# Patient Record
Sex: Male | Born: 1965
Health system: Southern US, Community
[De-identification: ages and names within clinical notes are randomized; demographics above are authoritative.]

## PROBLEM LIST (undated history)

## (undated) ENCOUNTER — Emergency Department (HOSPITAL_BASED_OUTPATIENT_CLINIC_OR_DEPARTMENT_OTHER): Admission: EM | Payer: 59

## (undated) DIAGNOSIS — K219 Gastro-esophageal reflux disease without esophagitis: Secondary | ICD-10-CM

## (undated) DIAGNOSIS — M1990 Unspecified osteoarthritis, unspecified site: Secondary | ICD-10-CM

## (undated) DIAGNOSIS — Z8041 Family history of malignant neoplasm of ovary: Secondary | ICD-10-CM

## (undated) DIAGNOSIS — D860 Sarcoidosis of lung: Secondary | ICD-10-CM

## (undated) DIAGNOSIS — C801 Malignant (primary) neoplasm, unspecified: Secondary | ICD-10-CM

## (undated) DIAGNOSIS — T7840XA Allergy, unspecified, initial encounter: Secondary | ICD-10-CM

## (undated) DIAGNOSIS — Z800 Family history of malignant neoplasm of digestive organs: Secondary | ICD-10-CM

## (undated) DIAGNOSIS — Z803 Family history of malignant neoplasm of breast: Secondary | ICD-10-CM

## (undated) HISTORY — PX: COLON SURGERY: SHX602

## (undated) HISTORY — DX: Gastro-esophageal reflux disease without esophagitis: K21.9

## (undated) HISTORY — DX: Allergy, unspecified, initial encounter: T78.40XA

## (undated) HISTORY — DX: Family history of malignant neoplasm of breast: Z80.3

## (undated) HISTORY — PX: COLONOSCOPY: SHX174

## (undated) HISTORY — PX: POLYPECTOMY: SHX149

## (undated) HISTORY — PX: NECK SURGERY: SHX720

## (undated) HISTORY — DX: Family history of malignant neoplasm of digestive organs: Z80.0

## (undated) HISTORY — DX: Family history of malignant neoplasm of ovary: Z80.41

---

## 1993-02-15 DIAGNOSIS — D869 Sarcoidosis, unspecified: Secondary | ICD-10-CM

## 1993-02-15 HISTORY — DX: Sarcoidosis, unspecified: D86.9

## 1996-02-16 DIAGNOSIS — D860 Sarcoidosis of lung: Secondary | ICD-10-CM

## 1996-02-16 HISTORY — DX: Sarcoidosis of lung: D86.0

## 1998-09-12 ENCOUNTER — Encounter: Payer: Self-pay | Admitting: Critical Care Medicine

## 1998-09-12 ENCOUNTER — Ambulatory Visit: Admission: RE | Admit: 1998-09-12 | Discharge: 1998-09-12 | Payer: Self-pay | Admitting: Critical Care Medicine

## 1998-09-12 ENCOUNTER — Encounter (INDEPENDENT_AMBULATORY_CARE_PROVIDER_SITE_OTHER): Payer: Self-pay | Admitting: Specialist

## 1998-09-15 ENCOUNTER — Ambulatory Visit: Admission: RE | Admit: 1998-09-15 | Discharge: 1998-09-15 | Payer: Self-pay | Admitting: Critical Care Medicine

## 2000-04-28 ENCOUNTER — Encounter: Payer: Self-pay | Admitting: Specialist

## 2000-04-28 ENCOUNTER — Ambulatory Visit (HOSPITAL_COMMUNITY): Admission: RE | Admit: 2000-04-28 | Discharge: 2000-04-28 | Payer: Self-pay | Admitting: Specialist

## 2002-07-09 ENCOUNTER — Ambulatory Visit (HOSPITAL_COMMUNITY): Admission: RE | Admit: 2002-07-09 | Discharge: 2002-07-09 | Payer: Self-pay | Admitting: Internal Medicine

## 2002-07-09 ENCOUNTER — Encounter: Payer: Self-pay | Admitting: Internal Medicine

## 2002-10-08 ENCOUNTER — Encounter: Admission: RE | Admit: 2002-10-08 | Discharge: 2002-10-08 | Payer: Self-pay | Admitting: Otolaryngology

## 2002-10-08 ENCOUNTER — Encounter: Payer: Self-pay | Admitting: Otolaryngology

## 2003-02-21 ENCOUNTER — Ambulatory Visit (HOSPITAL_COMMUNITY): Admission: RE | Admit: 2003-02-21 | Discharge: 2003-02-21 | Payer: Self-pay | Admitting: Endocrinology

## 2008-02-16 HISTORY — PX: KNEE ARTHROSCOPY: SUR90

## 2008-10-06 ENCOUNTER — Ambulatory Visit: Payer: Self-pay | Admitting: Interventional Radiology

## 2008-10-06 ENCOUNTER — Emergency Department (HOSPITAL_BASED_OUTPATIENT_CLINIC_OR_DEPARTMENT_OTHER): Admission: EM | Admit: 2008-10-06 | Discharge: 2008-10-06 | Payer: Self-pay | Admitting: Emergency Medicine

## 2008-11-05 ENCOUNTER — Ambulatory Visit (HOSPITAL_COMMUNITY): Admission: RE | Admit: 2008-11-05 | Discharge: 2008-11-05 | Payer: Self-pay | Admitting: Specialist

## 2009-01-17 ENCOUNTER — Ambulatory Visit (HOSPITAL_BASED_OUTPATIENT_CLINIC_OR_DEPARTMENT_OTHER): Admission: RE | Admit: 2009-01-17 | Discharge: 2009-01-17 | Payer: Self-pay | Admitting: Specialist

## 2010-05-19 LAB — POCT HEMOGLOBIN-HEMACUE: Hemoglobin: 16.2 g/dL (ref 13.0–17.0)

## 2011-08-27 ENCOUNTER — Emergency Department
Admission: EM | Admit: 2011-08-27 | Discharge: 2011-08-27 | Disposition: A | Discharge status: Home / Self Care | Payer: Self-pay | Source: Home / Self Care | Attending: Emergency Medicine | Admitting: Emergency Medicine

## 2011-08-27 DIAGNOSIS — L03115 Cellulitis of right lower limb: Secondary | ICD-10-CM

## 2011-08-27 DIAGNOSIS — L03119 Cellulitis of unspecified part of limb: Secondary | ICD-10-CM

## 2011-08-27 DIAGNOSIS — L02419 Cutaneous abscess of limb, unspecified: Secondary | ICD-10-CM

## 2011-08-27 HISTORY — DX: Sarcoidosis of lung: D86.0

## 2011-08-27 MED ORDER — DOXYCYCLINE HYCLATE 100 MG PO CAPS: 100.0000 mg | ORAL_CAPSULE | Freq: Two times a day (BID) | ORAL | Status: DC

## 2011-08-27 MED ORDER — DOXYCYCLINE HYCLATE 100 MG PO CAPS: 100.0000 mg | ORAL_CAPSULE | Freq: Two times a day (BID) | ORAL | Status: AC

## 2011-08-27 NOTE — ED Provider Notes (Signed)
History     CSN: [id number]  Arrival date & time 08/27/11  8413   First MD Initiated Contact with Patient 08/27/11 0830      Chief Complaint  Patient presents with  . Wound Infection    x 2 days     Patient is a 46 y.o. male presenting with rash. The history is provided by the patient.  Rash  The current episode started 2 days ago (Started as a pimple right anterior knee, he squeezed it and got slight pus out.). The problem has been rapidly worsening. Associated with: Pimple.--- No known exposure to contact dermatitis cause. There has been no fever. Affected Location: Right anterior knee. The pain is mild. The pain has been intermittent since onset. Associated symptoms include pain (And swelling of soft tissues over anterior knee. He denies pain within the knee joint itself.). Pertinent negatives include no blisters, no itching and no weeping. He has tried OTC analgesics (Ibuprofen) for the symptoms. The treatment provided no relief.    Past Medical History  Diagnosis Date  . Sarcoidosis of lung 1998    Past Surgical History  Procedure Date  . Knee arthroscopy 2010    right    Family History  Problem Relation Age of Onset  . Hypertension Father   . Cancer Other     Lung  . Cancer Other     Liver  . Cancer Other     pancreatic     History  Substance Use Topics  . Smoking status: Never Smoker   . Smokeless tobacco: Former Neurosurgeon  . Alcohol Use: 2.4 oz/week    4 Glasses of wine per week      Review of Systems  HENT: Negative.   Eyes: Negative.   Respiratory: Negative.   Cardiovascular: Negative.   Gastrointestinal: Negative.   Genitourinary: Negative.   Musculoskeletal: Negative for joint swelling.  Skin: Positive for rash. Negative for itching.  Neurological: Negative.   Hematological: Negative.   All other systems reviewed and are negative.    Allergies  Review of patient's allergies indicates no known allergies.  Home Medications   Current  Outpatient Rx  Name Route Sig Dispense Refill  . IBUPROFEN 200 MG PO TABS Oral Take 200 mg by mouth every 6 (six) hours as needed.    Marland Kitchen DOXYCYCLINE HYCLATE 100 MG PO CAPS Oral Take 1 capsule (100 mg total) by mouth 2 (two) times daily. 20 capsule 0    BP 134/79  Pulse 62  Temp 98.1 F (36.7 C) (Oral)  Resp 17  Ht 5' 11.5" (1.816 m)  Wt 194 lb (87.998 kg)  BMI 26.68 kg/m2  SpO2 100%  Physical Exam  Nursing note and vitals reviewed. Constitutional: He is oriented to person, place, and time. He appears well-developed and well-nourished. No distress.       Uncomfortable from right knee pain. He is able to walk and weight-bear OK.  HENT:  Head: Normocephalic and atraumatic.  Eyes: Conjunctivae and EOM are normal. Pupils are equal, round, and reactive to light. No scleral icterus.  Neck: Normal range of motion.  Cardiovascular: Normal rate.   Pulmonary/Chest: Effort normal.  Abdominal: He exhibits no distension.  Musculoskeletal: Normal range of motion.       Right knee: He exhibits normal range of motion, no effusion and no bony tenderness. No medial joint line, no lateral joint line and no patellar tendon tenderness noted.  Neurological: He is alert and oriented to person, place, and time.  Skin: Rash noted.  Psychiatric: He has a normal mood and affect.    skin: 3 x 3 cm area of erythema, induration, tenderness over right anterior knee soft tissues. In the center of this is a 2 x 2 millimeter area of raised fluctuance. No red streaks.  Legs: No cords or heat or tenderness to suggest DVT.  ED Course  Procedures (including critical care time)   Labs Reviewed  WOUND CULTURE   No results found.   1. Cellulitis of knee, right       MDM  Likely has cellulitis of soft tissue right anterior knee, with tiny abscess/fluctuance, 3 x 3 millimeters in the center of this. No evidence of its affecting the knee joint itself. No evidence of knee effusion.  After risks, benefits,  alternatives discussed, patient agrees with the following: Alcohol prep right anterior knee, and I performed an incision and drainage with a tiny puncture  with a #11 blade. A small amount of pus and blood exuded and this was swabbed and sent for culture. He tolerated the procedure well and the area again cleaned and Band-Aid applied. Good hemostasis. Doxycycline prescribed, will await wound culture. Questions invited and answered.        Lajean Manes, MD 08/27/11 1254

## 2011-08-27 NOTE — ED Notes (Signed)
Eon complains of redness, tenderness and swelling on his right knee. There is a lesion on his right knee that he believes to be infected. He denies fever, chills, or sweats.

## 2011-08-30 ENCOUNTER — Telehealth: Payer: Self-pay | Admitting: Emergency Medicine

## 2011-08-30 ENCOUNTER — Telehealth: Payer: Self-pay | Admitting: Family Medicine

## 2011-08-30 LAB — WOUND CULTURE
Gram Stain: NONE SEEN
Gram Stain: NONE SEEN

## 2013-03-12 ENCOUNTER — Emergency Department
Admission: EM | Admit: 2013-03-12 | Discharge: 2013-03-12 | Disposition: A | Discharge status: Home / Self Care | Payer: 59 | Source: Home / Self Care | Attending: Family Medicine | Admitting: Family Medicine

## 2013-03-12 ENCOUNTER — Encounter: Payer: Self-pay | Admitting: Emergency Medicine

## 2013-03-12 ENCOUNTER — Emergency Department (INDEPENDENT_AMBULATORY_CARE_PROVIDER_SITE_OTHER): Payer: 59

## 2013-03-12 DIAGNOSIS — W19XXXA Unspecified fall, initial encounter: Secondary | ICD-10-CM

## 2013-03-12 DIAGNOSIS — S2239XA Fracture of one rib, unspecified side, initial encounter for closed fracture: Secondary | ICD-10-CM

## 2013-03-12 DIAGNOSIS — S2232XA Fracture of one rib, left side, initial encounter for closed fracture: Secondary | ICD-10-CM

## 2013-03-12 MED ORDER — HYDROCODONE-ACETAMINOPHEN 5-325 MG PO TABS: ORAL_TABLET | ORAL | Status: DC

## 2013-03-12 MED ORDER — MELOXICAM 15 MG PO TABS: 15.0000 mg | ORAL_TABLET | Freq: Every day | ORAL | Status: DC

## 2013-03-12 NOTE — ED Notes (Signed)
Applied Rib belt per Dr. Assunta Found

## 2013-03-12 NOTE — ED Provider Notes (Signed)
CSN: [id number]     Arrival date & time 03/12/13  1600 History   First MD Initiated Contact with Patient 03/12/13 1646     Chief Complaint  Patient presents with  . Chest Pain    left ribs     HPI Comments: While snow skiing 48 hours ago, patient fell on his left side injuring his left chest.  He complains of persistent pain in his left chest, worse with movement and coughing.  Patient is a 48 y.o. male presenting with chest pain. The history is provided by the patient.  Chest Pain Pain location:  L lateral chest Pain quality: sharp   Pain radiates to:  Does not radiate Pain severity:  Mild Onset quality:  Sudden Duration:  2 days Timing:  Constant Progression:  Unchanged Chronicity:  New Context: trauma   Relieved by:  Nothing Worsened by:  Coughing and movement Ineffective treatments:  None tried Associated symptoms: no abdominal pain, no back pain, no cough, no diaphoresis, no fatigue, no fever, no nausea, no palpitations and no shortness of breath     Past Medical History  Diagnosis Date  . Sarcoidosis of lung 1998   Past Surgical History  Procedure Laterality Date  . Knee arthroscopy  2010    right   Family History  Problem Relation Age of Onset  . Hypertension Father   . Cancer Other     Lung  . Cancer Other     Liver  . Cancer Other     pancreatic    History  Substance Use Topics  . Smoking status: Never Smoker   . Smokeless tobacco: Former Systems developer  . Alcohol Use: 2.4 oz/week    4 Glasses of wine per week    Review of Systems  Constitutional: Negative for fever, diaphoresis and fatigue.  Respiratory: Negative for cough and shortness of breath.   Cardiovascular: Positive for chest pain. Negative for palpitations.  Gastrointestinal: Negative for nausea and abdominal pain.  Musculoskeletal: Negative for back pain.  All other systems reviewed and are negative.    Allergies  Review of patient's allergies indicates no known allergies.  Home Medications    Current Outpatient Rx  Name  Route  Sig  Dispense  Refill  . HYDROcodone-acetaminophen (NORCO/VICODIN) 5-325 MG per tablet      Take one by mouth at bedtime as needed for pain   10 tablet   0   . meloxicam (MOBIC) 15 MG tablet   Oral   Take 1 tablet (15 mg total) by mouth daily. Take with food each morning   15 tablet   0    BP 141/95  Pulse 68  Temp(Src) 98 F (36.7 C) (Oral)  Resp 16  Wt 215 lb (97.523 kg)  SpO2 99% Physical Exam  Nursing note and vitals reviewed. Constitutional: He is oriented to person, place, and time. He appears well-developed and well-nourished. No distress.  HENT:  Head: Atraumatic.  Eyes: Conjunctivae are normal. Pupils are equal, round, and reactive to light.  Neck: Normal range of motion.  Cardiovascular: Normal heart sounds.   Pulmonary/Chest: Breath sounds normal. No respiratory distress. He has no wheezes. He has no rales.   He exhibits tenderness.    There is tenderness to palpation left lateral chest as noted.  No ecchymosis, swelling, or crepitance.  Abdominal: There is no tenderness.  Neurological: He is alert and oriented to person, place, and time.  Skin: Skin is warm and dry.    ED Course  Procedures  none    Imaging Review Dg Ribs Unilateral W/chest Left  03/12/2013   CLINICAL DATA:  Fall on left lateral ribs with pain.  EXAM: LEFT RIBS AND CHEST - 3+ VIEW  COMPARISON:  None.  FINDINGS: Frontal view of the chest shows midline trachea and normal heart size. Lungs are clear. No pleural fluid. No pneumothorax.  There is a minimally displaced fracture of the left fifth posterior rib. There may be a nondisplaced fracture of the left fourth rib as well, laterally. Scapula appears grossly intact.  IMPRESSION: Acute left fifth posterior rib fracture. Suspect a nondisplaced fracture of the adjacent left fourth rib as well.   Electronically Signed   By: Lorin Picket M.D.   On: 03/12/2013 16:40      MDM   1. Left rib fracture      Rib belt applied.  Mobic 15mg  daily.  Lortab for pain at night. Apply ice pack 3 to 4 times daily for 2 to 3 days.  Wear rib belt daytime (discontinue if cough or shortness of breath develops).  Recommend Vitamin D and calcium supplement. If symptoms become significantly worse during the night or over the weekend, proceed to the local emergency room. Followup with Family Doctor if not improved in 2 to 3 weeks.    Kandra Nicolas, MD 03/14/13 [phone]

## 2013-03-12 NOTE — Discharge Instructions (Signed)
Apply ice pack 3 to 4 times daily for 2 to 3 days.  Wear rib belt daytime.  Recommend Vitamin D and calcium supplement. If symptoms become significantly worse during the night or over the weekend, proceed to the local emergency room.    Rib Fracture A rib fracture is a break or crack in one of the bones of the ribs. The ribs are a group of long, curved bones that wrap around your chest and attach to your spine. They protect your lungs and other organs in the chest cavity. A broken or cracked rib is often painful, but most do not cause other problems. Most rib fractures heal on their own over time. However, rib fractures can be more serious if multiple ribs are broken or if broken ribs move out of place and push against other structures. CAUSES   A direct blow to the chest. For example, this could happen during contact sports, a car accident, or a fall against a hard object.  Repetitive movements with high force, such as pitching a baseball or having severe coughing spells. SYMPTOMS   Pain when you breathe in or cough.  Pain when someone presses on the injured area. DIAGNOSIS  Your caregiver will perform a physical exam. Various imaging tests may be ordered to confirm the diagnosis and to look for related injuries. These tests may include a chest X-ray, computed tomography (CT), magnetic resonance imaging (MRI), or a bone scan. TREATMENT  Rib fractures usually heal on their own in 1 3 months. The longer healing period is often associated with a continued cough or other aggravating activities. During the healing period, pain control is very important. Medication is usually given to control pain. Hospitalization or surgery may be needed for more severe injuries, such as those in which multiple ribs are broken or the ribs have moved out of place.  HOME CARE INSTRUCTIONS   Avoid strenuous activity and any activities or movements that cause pain. Be careful during activities and avoid bumping the  injured rib.  Gradually increase activity as directed by your caregiver.  Only take over-the-counter or prescription medications as directed by your caregiver. Do not take other medications without asking your caregiver first.  Apply ice to the injured area for the first 1 2 days after you have been treated or as directed by your caregiver. Applying ice helps to reduce inflammation and pain.  Put ice in a plastic bag.  Place a towel between your skin and the bag.   Leave the ice on for 15 20 minutes at a time, every 2 hours while you are awake.  Perform deep breathing as directed by your caregiver. This will help prevent pneumonia, which is a common complication of a broken rib. Your caregiver may instruct you to:  Take deep breaths several times a day.  Try to cough several times a day, holding a pillow against the injured area.  Use a device called an incentive spirometer to practice deep breathing several times a day.  Drink enough fluids to keep your urine clear or pale yellow. This will help you avoid constipation.     SEEK IMMEDIATE MEDICAL CARE IF:   You have a fever.   You have difficulty breathing or shortness of breath.   You develop a continual cough, or you cough up thick or bloody sputum.  You feel sick to your stomach (nausea), throw up (vomit), or have abdominal pain.   You have worsening pain not controlled with medications.  MAKE  SURE YOU:  Understand these instructions.  Will watch your condition.  Will get help right away if you are not doing well or get worse. Document Released: 02/01/2005 Document Revised: 10/04/2012 Document Reviewed: 04/05/2012 [facility] Patient Information 2014 [place], [place].

## 2013-03-12 NOTE — ED Notes (Signed)
Brian Avery fell while skiing and landed on left side with arm and ski pole on left chest. C/o left rib pain, anterior and posterior. Pain is worse with movemtn and coughing.

## 2013-04-04 ENCOUNTER — Other Ambulatory Visit: Payer: Self-pay | Admitting: Podiatry

## 2013-04-04 NOTE — Telephone Encounter (Signed)
Patient has completed 90 days of Lamisil. No refilled needed.

## 2013-04-25 ENCOUNTER — Telehealth: Payer: Self-pay | Admitting: *Deleted

## 2013-04-25 NOTE — Telephone Encounter (Signed)
Pt states he left his antifungal medication in a hotel, would like a refill.

## 2013-04-26 MED ORDER — EFINACONAZOLE 10 % EX SOLN: 1.0000 [drp] | Freq: Every day | CUTANEOUS | Status: DC

## 2013-04-26 MED ORDER — TERBINAFINE HCL 250 MG PO TABS: 250.0000 mg | ORAL_TABLET | Freq: Every day | ORAL | Status: DC

## 2013-04-26 NOTE — Telephone Encounter (Signed)
Its back here on my desk at the base of the scanner.

## 2013-04-26 NOTE — Telephone Encounter (Signed)
Pt states left his rx for Lamisil in a hotel room and would like a refill of the Jublia also.  Dr Paulla Dolly states refill Lamisil 250mg  #30 once only, and the Jublia prn 1 year.  I told pt I would order the Jublia from [facility], for better insurance coverage.

## 2015-06-26 DIAGNOSIS — M25561 Pain in right knee: Secondary | ICD-10-CM | POA: Diagnosis not present

## 2015-07-01 DIAGNOSIS — M25561 Pain in right knee: Secondary | ICD-10-CM | POA: Diagnosis not present

## 2015-09-02 DIAGNOSIS — Z0000 Encounter for general adult medical examination without abnormal findings: Secondary | ICD-10-CM | POA: Diagnosis not present

## 2015-09-08 DIAGNOSIS — Z0000 Encounter for general adult medical examination without abnormal findings: Secondary | ICD-10-CM | POA: Diagnosis not present

## 2015-10-21 DIAGNOSIS — E789 Disorder of lipoprotein metabolism, unspecified: Secondary | ICD-10-CM | POA: Diagnosis not present

## 2015-11-11 DIAGNOSIS — Z0100 Encounter for examination of eyes and vision without abnormal findings: Secondary | ICD-10-CM | POA: Diagnosis not present

## 2016-01-13 NOTE — H&P (Signed)
  Brian Avery is an 50 y.o. male.    Chief Complaint: right knee pain  HPI: Pt is a 50 y.o. male complaining of right knee pain for multiple years. Pain had continually increased since the beginning. X-rays in the clinic show meniscal tear right knee. Pt has tried various conservative treatments which have failed to alleviate their symptoms, including injections and therapy. Various options are discussed with the patient. Risks, benefits and expectations were discussed with the patient. Patient understand the risks, benefits and expectations and wishes to proceed with surgery.   PCP:  No primary care provider on file.  D/C Plans: Home  PMH: Past Medical History:  Diagnosis Date  . Sarcoidosis of lung 1998    PSH: Past Surgical History:  Procedure Laterality Date  . KNEE ARTHROSCOPY  2010   right    Social History:  reports that he has never smoked. He has quit using smokeless tobacco. He reports that he drinks about 2.4 oz of alcohol per week . He reports that he does not use drugs.  Allergies:  No Known Allergies  Medications: No current facility-administered medications for this encounter.    Current Outpatient Prescriptions  Medication Sig Dispense Refill  . Efinaconazole (JUBLIA) 10 % SOLN Apply 1 drop topically daily. 1 Bottle prn  . HYDROcodone-acetaminophen (NORCO/VICODIN) 5-325 MG per tablet Take one by mouth at bedtime as needed for pain 10 tablet 0  . meloxicam (MOBIC) 15 MG tablet Take 1 tablet (15 mg total) by mouth daily. Take with food each morning 15 tablet 0  . terbinafine (LAMISIL) 250 MG tablet Take 1 tablet (250 mg total) by mouth daily. 30 tablet 0    No results found for this or any previous visit (from the past 48 hour(s)). No results found.  ROS: Pain with rom of the right lower extremity  Physical Exam:  Alert and oriented 50 y.o. male in no acute distress Cranial nerves 2-12 intact Cervical spine: full rom with no tenderness, nv intact  distally Chest: active breath sounds bilaterally, no wheeze rhonchi or rales Heart: regular rate and rhythm, no murmur Abd: non tender non distended with active bowel sounds Hip is stable with rom  Right knee with medial joint line tenderness nv intact distally No rashes or edema  Assessment/Plan Assessment: right knee meniscal tear  Plan: Patient will undergo a right knee arthroscopy by Dr. Veverly Fells at [facility]. Risks benefits and expectations were discussed with the patient. Patient understand risks, benefits and expectations and wishes to proceed.

## 2016-01-21 ENCOUNTER — Encounter (HOSPITAL_COMMUNITY): Payer: Self-pay

## 2016-01-21 NOTE — Pre-Procedure Instructions (Signed)
Brian Avery  01/21/2016      [id number] - [place], [place] - [address] [place] [address] Phone: [phone] Fax: [place], [place] [address], [address] [place] [address] Phone: [phone] Fax: [phone]    Your procedure is scheduled on December 15  Report to [facility] at 1100 A.M.  Call this number if you have problems the morning of surgery:  [phone]   Remember:  Do not eat food or drink liquids after midnight.   Take these medicines the morning of surgery with A SIP OF WATER NONE  7 days prior to surgery STOP taking any MOBIC Aspirin, Aleve, Naproxen, Ibuprofen, Motrin, Advil, Goody's, BC's, all herbal medications, fish oil, and all vitamins    Do not wear jewelry.  Do not wear lotions, powders, or cologne, or deoderant.  Men may shave face and neck.  Do not bring valuables to the hospital.  [facility] is not responsible for any belongings or valuables.  Contacts, dentures or bridgework may not be worn into surgery.  Leave your suitcase in the car.  After surgery it may be brought to your room.  For patients admitted to the hospital, discharge time will be determined by your treatment team.  Patients discharged the day of surgery will not be allowed to drive home.    Special instructions:   [facility]- Preparing For Surgery  Before surgery, you can play an important role. Because skin is not sterile, your skin needs to be as free of germs as possible. You can reduce the number of germs on your skin by washing with CHG (chlorahexidine gluconate) Soap before surgery.  CHG is an antiseptic cleaner which kills germs and bonds with the skin to continue killing germs even after washing.  Please do not use if you have an allergy to CHG or antibacterial soaps. If your skin  becomes reddened/irritated stop using the CHG.  Do not shave (including legs and underarms) for at least 48 hours prior to first CHG shower. It is OK to shave your face.  Please follow these instructions carefully.   1. Shower the NIGHT BEFORE SURGERY and the MORNING OF SURGERY with CHG.   2. If you chose to wash your hair, wash your hair first as usual with your normal shampoo.  3. After you shampoo, rinse your hair and body thoroughly to remove the shampoo.  4. Use CHG as you would any other liquid soap. You can apply CHG directly to the skin and wash gently with a scrungie or a clean washcloth.   5. Apply the CHG Soap to your body ONLY FROM THE NECK DOWN.  Do not use on open wounds or open sores. Avoid contact with your eyes, ears, mouth and genitals (private parts). Wash genitals (private parts) with your normal soap.  6. Wash thoroughly, paying special attention to the area where your surgery will be performed.  7. Thoroughly rinse your body with warm water from the neck down.  8. DO NOT shower/wash with your normal soap after using and rinsing off the CHG Soap.  9. Pat yourself dry with a CLEAN TOWEL.   10. Wear CLEAN PAJAMAS   11. Place CLEAN SHEETS on your bed the night of your first shower and DO NOT SLEEP WITH PETS.    Day of Surgery:  Do not apply any deodorants/lotions. Please wear clean clothes to the hospital/surgery center.      Please read over the following fact sheets that you were given.

## 2016-01-22 ENCOUNTER — Encounter (HOSPITAL_COMMUNITY): Payer: Self-pay

## 2016-01-22 ENCOUNTER — Encounter (HOSPITAL_COMMUNITY)
Admission: RE | Admit: 2016-01-22 | Discharge: 2016-01-22 | Disposition: A | Discharge status: Home / Self Care | Payer: 59 | Source: Ambulatory Visit | Attending: Orthopedic Surgery | Admitting: Orthopedic Surgery

## 2016-01-22 DIAGNOSIS — M23203 Derangement of unspecified medial meniscus due to old tear or injury, right knee: Secondary | ICD-10-CM | POA: Insufficient documentation

## 2016-01-22 HISTORY — DX: Unspecified osteoarthritis, unspecified site: M19.90

## 2016-01-22 LAB — CBC
HCT: 45.9 % (ref 39.0–52.0)
HEMOGLOBIN: 15.4 g/dL (ref 13.0–17.0)
MCH: 32 pg (ref 26.0–34.0)
MCHC: 33.6 g/dL (ref 30.0–36.0)
MCV: 95.4 fL (ref 78.0–100.0)
Platelets: 240 10*3/uL (ref 150–400)
RBC: 4.81 MIL/uL (ref 4.22–5.81)
RDW: 13.3 % (ref 11.5–15.5)
WBC: 5.9 10*3/uL (ref 4.0–10.5)

## 2016-01-22 LAB — BASIC METABOLIC PANEL
Anion gap: 9 (ref 5–15)
BUN: 10 mg/dL (ref 6–20)
CO2: 23 mmol/L (ref 22–32)
CREATININE: 1 mg/dL (ref 0.61–1.24)
Calcium: 9 mg/dL (ref 8.9–10.3)
Chloride: 105 mmol/L (ref 101–111)
GFR calc Af Amer: >60 mL/min (ref >60)
GLUCOSE: 96 mg/dL (ref 65–99)
POTASSIUM: 4.5 mmol/L (ref 3.5–5.1)
Sodium: 137 mmol/L (ref 135–145)

## 2016-01-22 NOTE — Progress Notes (Signed)
PCP - Anda Kraft Cardiologist - denies  Chest x-ray - not needed EKG - denies Stress Test -denies  ECHO - denies Cardiac Cath - denies  Patient does not have any cardiac history.    Patient denies shortness of breath, fever, cough and chest pain at PAT appointment

## 2016-01-30 ENCOUNTER — Encounter (HOSPITAL_COMMUNITY)
Admission: RE | Disposition: A | Discharge status: Home / Self Care | Payer: Self-pay | Source: Ambulatory Visit | Attending: Orthopedic Surgery

## 2016-01-30 ENCOUNTER — Ambulatory Visit (HOSPITAL_COMMUNITY): Payer: 59 | Admitting: Anesthesiology

## 2016-01-30 ENCOUNTER — Encounter (HOSPITAL_COMMUNITY): Payer: Self-pay | Admitting: Surgery

## 2016-01-30 ENCOUNTER — Ambulatory Visit (HOSPITAL_COMMUNITY)
Admission: RE | Admit: 2016-01-30 | Discharge: 2016-01-30 | Disposition: A | Discharge status: Home / Self Care | Payer: 59 | Source: Ambulatory Visit | Attending: Orthopedic Surgery | Admitting: Orthopedic Surgery

## 2016-01-30 ENCOUNTER — Telehealth (HOSPITAL_COMMUNITY): Payer: Self-pay | Admitting: *Deleted

## 2016-01-30 DIAGNOSIS — Z8709 Personal history of other diseases of the respiratory system: Secondary | ICD-10-CM | POA: Insufficient documentation

## 2016-01-30 DIAGNOSIS — M2241 Chondromalacia patellae, right knee: Secondary | ICD-10-CM | POA: Diagnosis not present

## 2016-01-30 DIAGNOSIS — S83231A Complex tear of medial meniscus, current injury, right knee, initial encounter: Secondary | ICD-10-CM | POA: Diagnosis not present

## 2016-01-30 DIAGNOSIS — X58XXXA Exposure to other specified factors, initial encounter: Secondary | ICD-10-CM | POA: Diagnosis not present

## 2016-01-30 DIAGNOSIS — Z87891 Personal history of nicotine dependence: Secondary | ICD-10-CM | POA: Diagnosis not present

## 2016-01-30 DIAGNOSIS — Z79899 Other long term (current) drug therapy: Secondary | ICD-10-CM | POA: Insufficient documentation

## 2016-01-30 DIAGNOSIS — M1990 Unspecified osteoarthritis, unspecified site: Secondary | ICD-10-CM | POA: Diagnosis not present

## 2016-01-30 DIAGNOSIS — M94261 Chondromalacia, right knee: Secondary | ICD-10-CM | POA: Diagnosis not present

## 2016-01-30 DIAGNOSIS — S83241A Other tear of medial meniscus, current injury, right knee, initial encounter: Secondary | ICD-10-CM | POA: Diagnosis not present

## 2016-01-30 HISTORY — PX: KNEE ARTHROSCOPY: SHX127

## 2016-01-30 SURGERY — ARTHROSCOPY, KNEE
Anesthesia: General | Laterality: Right

## 2016-01-30 MED ORDER — BUPIVACAINE-EPINEPHRINE (PF) 0.25% -1:200000 IJ SOLN
INTRAMUSCULAR | Status: AC
  Filled 2016-01-30: qty 30

## 2016-01-30 MED ORDER — MIDAZOLAM HCL 2 MG/2ML IJ SOLN
INTRAMUSCULAR | Status: AC
  Filled 2016-01-30: qty 2

## 2016-01-30 MED ORDER — OXYCODONE-ACETAMINOPHEN 5-325 MG PO TABS: 1.0000 | ORAL_TABLET | ORAL | 0 refills | Status: DC | PRN

## 2016-01-30 MED ORDER — OXYCODONE-ACETAMINOPHEN 5-325 MG PO TABS
ORAL_TABLET | ORAL | Status: AC
  Filled 2016-01-30: qty 1

## 2016-01-30 MED ORDER — MIDAZOLAM HCL 5 MG/5ML IJ SOLN
INTRAMUSCULAR | Status: DC | PRN
  Administered 2016-01-30: 2 mg via INTRAVENOUS

## 2016-01-30 MED ORDER — METHOCARBAMOL 500 MG PO TABS: 500.0000 mg | ORAL_TABLET | Freq: Three times a day (TID) | ORAL | 1 refills | Status: DC | PRN

## 2016-01-30 MED ORDER — ONDANSETRON HCL 4 MG/2ML IJ SOLN
INTRAMUSCULAR | Status: DC | PRN
  Administered 2016-01-30: 4 mg via INTRAVENOUS

## 2016-01-30 MED ORDER — BUPIVACAINE-EPINEPHRINE (PF) 0.25% -1:200000 IJ SOLN
INTRAMUSCULAR | Status: DC | PRN
  Administered 2016-01-30: 10 mL via PERINEURAL

## 2016-01-30 MED ORDER — HYDROMORPHONE HCL 1 MG/ML IJ SOLN
0.2500 mg | INTRAMUSCULAR | Status: DC | PRN
  Administered 2016-01-30 (×2): 0.5 mg via INTRAVENOUS

## 2016-01-30 MED ORDER — PROPOFOL 10 MG/ML IV BOLUS
INTRAVENOUS | Status: AC
  Filled 2016-01-30: qty 20

## 2016-01-30 MED ORDER — LACTATED RINGERS IV SOLN: INTRAVENOUS | Status: DC

## 2016-01-30 MED ORDER — CHLORHEXIDINE GLUCONATE 4 % EX LIQD: 60.0000 mL | Freq: Once | CUTANEOUS | Status: DC

## 2016-01-30 MED ORDER — MEPERIDINE HCL 25 MG/ML IJ SOLN: 6.2500 mg | INTRAMUSCULAR | Status: DC | PRN

## 2016-01-30 MED ORDER — PHENYLEPHRINE HCL 10 MG/ML IJ SOLN
INTRAVENOUS | Status: DC | PRN
  Administered 2016-01-30: 25 ug/min via INTRAVENOUS

## 2016-01-30 MED ORDER — LIDOCAINE 2% (20 MG/ML) 5 ML SYRINGE
INTRAMUSCULAR | Status: DC | PRN
  Administered 2016-01-30: 80 mg via INTRAVENOUS

## 2016-01-30 MED ORDER — KETOROLAC TROMETHAMINE 30 MG/ML IJ SOLN
30.0000 mg | Freq: Once | INTRAMUSCULAR | Status: AC
  Administered 2016-01-30: 30 mg via INTRAVENOUS
  Filled 2016-01-30: qty 1

## 2016-01-30 MED ORDER — LACTATED RINGERS IV SOLN
INTRAVENOUS | Status: DC
  Administered 2016-01-30: 14:00:00 via INTRAVENOUS

## 2016-01-30 MED ORDER — HYDROMORPHONE HCL 1 MG/ML IJ SOLN
INTRAMUSCULAR | Status: AC
  Filled 2016-01-30: qty 0.5

## 2016-01-30 MED ORDER — CEFAZOLIN SODIUM-DEXTROSE 2-4 GM/100ML-% IV SOLN
2.0000 g | INTRAVENOUS | Status: AC
  Administered 2016-01-30: 2 g via INTRAVENOUS
  Filled 2016-01-30: qty 100

## 2016-01-30 MED ORDER — FENTANYL CITRATE (PF) 100 MCG/2ML IJ SOLN
INTRAMUSCULAR | Status: AC
  Filled 2016-01-30: qty 2

## 2016-01-30 MED ORDER — KETOROLAC TROMETHAMINE 10 MG PO TABS: 10.0000 mg | ORAL_TABLET | Freq: Three times a day (TID) | ORAL | 0 refills | Status: DC

## 2016-01-30 MED ORDER — KETOROLAC TROMETHAMINE 30 MG/ML IJ SOLN
INTRAMUSCULAR | Status: AC
  Filled 2016-01-30: qty 1

## 2016-01-30 MED ORDER — FENTANYL CITRATE (PF) 100 MCG/2ML IJ SOLN
INTRAMUSCULAR | Status: DC | PRN
  Administered 2016-01-30: 25 ug via INTRAVENOUS
  Administered 2016-01-30: 50 ug via INTRAVENOUS

## 2016-01-30 MED ORDER — PROPOFOL 10 MG/ML IV BOLUS
INTRAVENOUS | Status: DC | PRN
  Administered 2016-01-30: 200 mg via INTRAVENOUS

## 2016-01-30 MED ORDER — OXYCODONE-ACETAMINOPHEN 5-325 MG PO TABS
1.0000 | ORAL_TABLET | Freq: Once | ORAL | Status: AC
  Administered 2016-01-30: 1 via ORAL

## 2016-01-30 MED ORDER — ONDANSETRON HCL 4 MG/2ML IJ SOLN
INTRAMUSCULAR | Status: AC
  Filled 2016-01-30: qty 2

## 2016-01-30 MED ORDER — SODIUM CHLORIDE 0.9 % IR SOLN
Status: DC | PRN
  Administered 2016-01-30 (×2): 3000 mL

## 2016-01-30 MED ORDER — PROMETHAZINE HCL 25 MG/ML IJ SOLN: 6.2500 mg | INTRAMUSCULAR | Status: DC | PRN

## 2016-01-30 SURGICAL SUPPLY — 39 items
BLADE CUTTER GATOR 3.5 (BLADE) ×3 IMPLANT
BNDG CMPR MED 10X6 ELC LF (GAUZE/BANDAGES/DRESSINGS) ×1
BNDG COHESIVE 6X5 TAN STRL LF (GAUZE/BANDAGES/DRESSINGS) ×3 IMPLANT
BNDG ELASTIC 6X10 VLCR STRL LF (GAUZE/BANDAGES/DRESSINGS) ×3 IMPLANT
BNDG GAUZE ELAST 4 BULKY (GAUZE/BANDAGES/DRESSINGS) ×4 IMPLANT
CLOSURE STERI-STRIP 1/2X4 (GAUZE/BANDAGES/DRESSINGS) ×1
CLOSURE WOUND 1/2 X4 (GAUZE/BANDAGES/DRESSINGS) ×1
CLSR STERI-STRIP ANTIMIC 1/2X4 (GAUZE/BANDAGES/DRESSINGS) ×1 IMPLANT
DRAPE ARTHROSCOPY W/POUCH 114 (DRAPES) ×3 IMPLANT
DRSG PAD ABDOMINAL 8X10 ST (GAUZE/BANDAGES/DRESSINGS) ×2 IMPLANT
DURAPREP 26ML APPLICATOR (WOUND CARE) ×3 IMPLANT
ELECT MENISCUS 165MM 90D (ELECTRODE) IMPLANT
ELECT REM PT RETURN 9FT ADLT (ELECTROSURGICAL) ×3
ELECTRODE REM PT RTRN 9FT ADLT (ELECTROSURGICAL) ×1 IMPLANT
GAUZE SPONGE 4X4 12PLY STRL (GAUZE/BANDAGES/DRESSINGS) ×2 IMPLANT
GAUZE SPONGE 4X4 16PLY XRAY LF (GAUZE/BANDAGES/DRESSINGS) ×3 IMPLANT
GLOVE BIOGEL PI ORTHO PRO SZ8 (GLOVE) ×2
GLOVE PI ORTHO PRO STRL SZ8 (GLOVE) ×1 IMPLANT
GLOVE SURG ORTHO 8.5 STRL (GLOVE) ×3 IMPLANT
GOWN STRL REUS W/ TWL XL LVL3 (GOWN DISPOSABLE) ×2 IMPLANT
GOWN STRL REUS W/TWL XL LVL3 (GOWN DISPOSABLE) ×6
IV NS IRRIG 3000ML ARTHROMATIC (IV SOLUTION) ×4 IMPLANT
KIT BASIN OR (CUSTOM PROCEDURE TRAY) ×3 IMPLANT
KIT ROOM TURNOVER OR (KITS) ×3 IMPLANT
MANIFOLD NEPTUNE II (INSTRUMENTS) ×3 IMPLANT
PACK ARTHROSCOPY DSU (CUSTOM PROCEDURE TRAY) ×3 IMPLANT
PAD ARMBOARD 7.5X6 YLW CONV (MISCELLANEOUS) ×6 IMPLANT
PENCIL BUTTON HOLSTER BLD 10FT (ELECTRODE) IMPLANT
PROBE BIPOLAR ATHRO 135MM 90D (MISCELLANEOUS) ×2 IMPLANT
SET ARTHROSCOPY TUBING (MISCELLANEOUS) ×3
SET ARTHROSCOPY TUBING LN (MISCELLANEOUS) ×1 IMPLANT
STRIP CLOSURE SKIN 1/2X4 (GAUZE/BANDAGES/DRESSINGS) ×2 IMPLANT
SUT MNCRL AB 4-0 PS2 18 (SUTURE) ×3 IMPLANT
TOWEL OR 17X24 6PK STRL BLUE (TOWEL DISPOSABLE) ×6 IMPLANT
TUBE CONNECTING 12'X1/4 (SUCTIONS) ×1
TUBE CONNECTING 12X1/4 (SUCTIONS) ×2 IMPLANT
WAND HAND CNTRL MULTIVAC 90 (MISCELLANEOUS) IMPLANT
WATER STERILE IRR 1000ML POUR (IV SOLUTION) ×1 IMPLANT
WRAP KNEE MAXI GEL POST OP (GAUZE/BANDAGES/DRESSINGS) ×3 IMPLANT

## 2016-01-30 NOTE — Transfer of Care (Signed)
Immediate Anesthesia Transfer of Care Note  Patient: Brian Avery  Procedure(s) Performed: Procedure(s): ARTHROSCOPY KNEE (Right)  Patient Location: PACU  Anesthesia Type:General  Level of Consciousness: awake, alert  and oriented  Airway & Oxygen Therapy: Patient Spontanous Breathing and Patient connected to nasal cannula oxygen  Post-op Assessment: Report given to RN, Post -op Vital signs reviewed and stable and Patient moving all extremities X 4  Post vital signs: Reviewed and stable  Last Vitals:  Vitals:   01/30/16 1400 01/30/16 1731  BP: (!) 154/91   Pulse: 75   Resp: 20   Temp: 36.9 C 36.3 C    Last Pain:  Vitals:   01/30/16 1731  TempSrc:   PainSc: 0-No pain      Patients Stated Pain Goal: 3 ([id number])  Complications: No apparent anesthesia complications

## 2016-01-30 NOTE — Brief Op Note (Signed)
01/30/2016  5:29 PM  PATIENT:  Brian Avery  50 y.o. male  PRE-OPERATIVE DIAGNOSIS:  RIGHT KNEE MEDIAL MENISCUS TEAR, chondromalacia   POST-OPERATIVE DIAGNOSIS:  RIGHT KNEE MEDIAL MENISCUS TEAR, chondromalacia   PROCEDURE:  Procedure(s): ARTHROSCOPY KNEE (Right) partial medial meniscectomy and chondroplasty med and patellofemoral  SURGEON:  Surgeon(s) and Role:    * Netta Cedars, MD - Primary  PHYSICIAN ASSISTANT:   ASSISTANTS: none   ANESTHESIA:   local and general  EBL:  No intake/output data recorded.  BLOOD ADMINISTERED:none  DRAINS: none   LOCAL MEDICATIONS USED:  MARCAINE     SPECIMEN:  No Specimen  DISPOSITION OF SPECIMEN:  N/A  COUNTS:  YES  TOURNIQUET:  * No tourniquets in log *  DICTATION: .Other Dictation: Dictation Number [id number]  PLAN OF CARE: Discharge to home after PACU  PATIENT DISPOSITION:  PACU - hemodynamically stable.   Delay start of Pharmacological VTE agent (>24hrs) due to surgical blood loss or risk of bleeding: not applicable

## 2016-01-30 NOTE — Discharge Instructions (Signed)
Ice to the knee as much as possible.  Use the crutches to take most of your body weight off the right leg, but make sure to walk correctly, heel to toe. Keep the surgical bandage in place until Sunday then change to Band Aids over the Steri Strips and then pull the other TED hose on.  Keep on both legs until you see Dr Veverly Fells in the office  Elevate the right leg when you can, prop under the ankle NOT the knee. Keep knee out straight when resting it.  Do the following exercises every hour while awake for 5 minutes -   Ankle pumps Heel slides (knee bending) Thigh tightening and straight leg raising,  Take one 325 mg Aspirin per day for 30 days for blood clot prevention  Follow up with Dr Veverly Fells or Leroy Sea in the office in two weeks, call [phone]

## 2016-01-30 NOTE — Interval H&P Note (Signed)
History and Physical Interval Note:  01/30/2016 3:56 PM  Juline Patch  has presented today for surgery, with the diagnosis of RIGHT KNEE MEDIAL MENISCUS TEAR  The various methods of treatment have been discussed with the patient and family. After consideration of risks, benefits and other options for treatment, the patient has consented to  Procedure(s): ARTHROSCOPY KNEE (Right) as a surgical intervention .  The patient's history has been reviewed, patient examined, no change in status, stable for surgery.  I have reviewed the patient's chart and labs.  Questions were answered to the patient's satisfaction.     Nickole Adamek,STEVEN R

## 2016-01-30 NOTE — Anesthesia Preprocedure Evaluation (Addendum)
Anesthesia Evaluation  Patient identified by MRN, date of birth, ID band Patient awake    Reviewed: Allergy & Precautions, NPO status , Patient's Chart, lab work & pertinent test results  Airway Mallampati: I  TM Distance: >3 FB Neck ROM: Full    Dental  (+) Teeth Intact, Dental Advisory Given   Pulmonary neg pulmonary ROS,    breath sounds clear to auscultation       Cardiovascular negative cardio ROS   Rhythm:Regular Rate:Normal     Neuro/Psych negative neurological ROS  negative psych ROS   GI/Hepatic negative GI ROS, Neg liver ROS,   Endo/Other  negative endocrine ROS  Renal/GU negative Renal ROS  negative genitourinary   Musculoskeletal  (+) Arthritis , Osteoarthritis,    Abdominal   Peds negative pediatric ROS (+)  Hematology negative hematology ROS (+)   Anesthesia Other Findings   Reproductive/Obstetrics negative OB ROS                            Anesthesia Physical Anesthesia Plan  ASA: I  Anesthesia Plan: General   Post-op Pain Management:    Induction: Intravenous  Airway Management Planned: LMA  Additional Equipment:   Intra-op Plan:   Post-operative Plan: Extubation in OR  Informed Consent: I have reviewed the patients History and Physical, chart, labs and discussed the procedure including the risks, benefits and alternatives for the proposed anesthesia with the patient or authorized representative who has indicated his/her understanding and acceptance.   Dental advisory given  Plan Discussed with: Anesthesiologist and CRNA  Anesthesia Plan Comments:         Anesthesia Quick Evaluation

## 2016-01-30 NOTE — Anesthesia Procedure Notes (Signed)
Procedure Name: LMA Insertion Date/Time: 01/30/2016 4:07 PM Performed by: Rush Farmer E Pre-anesthesia Checklist: Patient identified, Emergency Drugs available, Suction available and Patient being monitored Patient Re-evaluated:Patient Re-evaluated prior to inductionOxygen Delivery Method: Circle system utilized Preoxygenation: Pre-oxygenation with 100% oxygen Intubation Type: IV induction LMA: LMA inserted LMA Size: 4.0 Number of attempts: 1 Placement Confirmation: positive ETCO2 and breath sounds checked- equal and bilateral Tube secured with: Tape Dental Injury: Teeth and Oropharynx as per pre-operative assessment

## 2016-02-01 NOTE — Anesthesia Postprocedure Evaluation (Signed)
Anesthesia Post Note  Patient: Brian Avery  Procedure(s) Performed: Procedure(s) (LRB): ARTHROSCOPY KNEE (Right)  Patient location during evaluation: PACU Anesthesia Type: General Level of consciousness: awake and alert Pain management: pain level controlled Vital Signs Assessment: post-procedure vital signs reviewed and stable Respiratory status: spontaneous breathing, nonlabored ventilation, respiratory function stable and patient connected to nasal cannula oxygen Cardiovascular status: blood pressure returned to baseline and stable Postop Assessment: no signs of nausea or vomiting Anesthetic complications: no    Last Vitals:  Vitals:   01/30/16 1731 01/30/16 1830  BP: 121/74   Pulse: 73   Resp: 15   Temp: 36.3 C 36.4 C    Last Pain:  Vitals:   01/30/16 1830  TempSrc:   PainSc: [place]

## 2016-02-02 ENCOUNTER — Encounter (HOSPITAL_COMMUNITY): Payer: Self-pay | Admitting: Orthopedic Surgery

## 2016-02-02 NOTE — Op Note (Signed)
NAME:  Brian Avery, Brian Avery                     ACCOUNT NO.:  MEDICAL RECORD NO.:  [id number]  LOCATION:                                 FACILITY:  PHYSICIAN:  Doran Heater. Veverly Fells, M.D.      DATE OF BIRTH:  DATE OF PROCEDURE:  01/30/2016 DATE OF DISCHARGE:                              OPERATIVE REPORT   PREOPERATIVE DIAGNOSES: 1. Right knee medial meniscus tear. 2. Chondromalacia.  POSTOPERATIVE DIAGNOSES: 1. Right knee medial meniscus tear. 2. Chondromalacia.  PROCEDURE PERFORMED:  Right knee arthroscopy, partial meniscectomy, and chondroplasty, not bleeding bone, medial and patellofemoral joint.  ATTENDING SURGEON:  Doran Heater. Veverly Fells, MD.  ASSISTANT:  None.  ANESTHESIA:  LMA general anesthesia was used plus local.  ESTIMATED BLOOD LOSS:  Minimal.  FLUID REPLACEMENT:  1200 mL crystalloid.  INSTRUMENT COUNTS:  Correct.  COMPLICATIONS:  There were no complications.  ANTIBIOTICS:  Perioperative antibiotics were given.  INDICATIONS:  The patient is a 50 year old male with worsening right medial knee pain secondary to a displaced medial meniscus tear.  The patient has had pain now for greater than 6 months.  He has exhausted all measures of conservative management including modification of activity, anti-inflammatory medications, and desires knee arthroscopy to remove the unstable meniscal tissue and restore function and eliminate pain in the knee.  Informed consent obtained.  DESCRIPTION OF PROCEDURE:  After an adequate level of anesthesia was achieved, the patient was positioned in the supine position.  The right leg correctly identified, placed on arthroscopic leg holder.  Left leg placed in a well leg holder and sterilely prepped and drape of the right knee.  We entered the knee using standard arthroscopic portals including superolateral outflow, anterolateral scope, and the anteromedial working portals.  I identified significant tearing of the medial meniscus.   Upon entering the medial compartment, this was a complex tear, not amenable to repair, was a primarily horizontal cleavage tear, but some little bit of radial component to it.  We went ahead and removed all unstable meniscal tissue using basket forceps and motorized shaver.  About 20% to 30% of posterior horn of meniscus had to be removed.  Meniscal root was easily visualized and completely intact and __________ preserved.  The meniscus basically was normal from the midbody forward as probed and visualized from both femoral and tibial surfaces.  The articular cartilage weightbearing surface medial femoral condyle had a fairly large grade 3 chondromalacia area.  This was not very deep, but there was definitely loose flaps and fibrillation.  Tangential chondroplasty technique used to remove unstable articular cartilage and smooth transition between the normal areas in the periphery in the more central area that was worn.  Tibial cartilage were looked normal.  ACL, PCL intact.  Lateral compartment completely pristine.  Meniscus probed and visually normal, probed and felt to be stable.  The articular cartilage as well normal.  Scoped the posterior aspect of the knee and no loose meniscal fragments identified.  The final inspection throughout the entire knee joint, there was grade 3 chondromalacia under the patella. None on the trochlea.  We just did a gentle tangential chondroplasty there as there was  just probably 0.5 cm by 3/4 cm area of erupted cartilage.  There was some loose foot fibrillation and flaps.  Once that was debrided, we did a final lavage of the knee joint suturing the wounds with 4-0 Monocryl followed by Steri-Strips and sterile compressive bandage.  The patient tolerated the surgery well.     Doran Heater. Veverly Fells, M.D.     SRN/MEDQ  D:  01/30/2016  T:  01/31/2016  Job:  [id number]

## 2016-09-20 ENCOUNTER — Ambulatory Visit: Payer: 59 | Admitting: Sports Medicine

## 2016-10-07 ENCOUNTER — Ambulatory Visit (INDEPENDENT_AMBULATORY_CARE_PROVIDER_SITE_OTHER): Payer: 59 | Admitting: Sports Medicine

## 2016-10-07 VITALS — BP 132/84 | Ht 72.0 in | Wt 205.0 lb

## 2016-10-07 DIAGNOSIS — G8929 Other chronic pain: Secondary | ICD-10-CM

## 2016-10-07 DIAGNOSIS — M25522 Pain in left elbow: Secondary | ICD-10-CM

## 2016-10-07 DIAGNOSIS — M25561 Pain in right knee: Secondary | ICD-10-CM | POA: Diagnosis not present

## 2016-10-07 NOTE — Progress Notes (Signed)
Subjective:    Patient ID: Brian Avery, male    DOB: 12-21-1965, 51 y.o.   MRN: [id number]  HPI Brian Avery is a 51 yo very pleasant male presenting with multiple MSK complaints.  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  Left elbow pain - 2/2 fall at work - Occurred 6-7 years ago - Bothered him for a few months after and then got x-rays which showed sheared off bone spur per patient - X-rays from 1-2 years ago brought in today which were taken for a different injury do show fragmented bone spur - Pain worse with activities such as push ups, planks, tricep extensions - Has taken ibuprofen and tried activity modification - Denies swelling, catching, or locking  Right knee pain - 2/2 fall with hyperextension mechanism while doing yardwork at home - Feels like worsening - Constat, low, dull throb - Worse with squatting - Gets stiff after being in same position for long periods of time such as sitting or standing - Reports swelling and sensation of lateral instability - Denies catching, popping - Ice helps a little - Right knee scope with partial meniscectomy and chondroplasty of medial and patellofemoral joint 01/2016 for medial meniscus tear and articular cartilage weightbearing surface medial femoral condyle had a fairly large grade 3 chondromalacia area per report.  Also noted, grade 3 chondromalacia under the patella.  Bilateral heel pain - Right worse than left - A lot of discomfort standing and walking - Can be on feet for 10-12 hours per day with work - Worse in morning - Denies injuries - Has tried OTC Dr. Zoe Lan inserts previously  Right shoulder - MRI in 2010 shoes labral tear, but did not want surgery at the time per patient - [place] 2 weeks ago and has had pain flare up since then  Review of Systems Per HPI    Objective:   Physical Exam    Constitutional: He is oriented to person, place, and time. He appears well-developed and well-nourished.  HENT:  Head: Normocephalic and atraumatic.  Eyes: Conjunctivae are normal.  Pulmonary/Chest: Effort normal.  Abdominal: He exhibits no distension.  Musculoskeletal:       Right shoulder: He exhibits crepitus and pain. He exhibits normal range of motion, no tenderness, no bony tenderness, no swelling and normal strength.       Right knee: He exhibits normal range of motion, no ecchymosis, no deformity and no erythema. Tenderness found. Medial joint line tenderness noted. No lateral joint line tenderness noted.       Left forearm: He exhibits tenderness (Olecranon process) and bony tenderness (Olecranon process). He exhibits no swelling, no edema, no deformity and no laceration.  L elbow: Full ROM with flexion, extension, supination, pronation.  Strength 5/5 with flexion, extension, supination, and pronation, but does have pain with resisted extension.  Neurovascularly intact  R knee: Full ROM with flexion and extension.  Strength 5/5 with flexion and extension.  Thessaly's positive.   Bilateral feet:  TTP at origin of plantar fascia bilaterally.  Heel squeeze negative.  Right shoulder: Good ROM.  Crank test negative.  O'brien's negative.  Neurological: He is alert and oriented to person, place, and time.  Skin: Skin is warm.  Psychiatric: He has a normal mood and affect. His behavior is normal. Judgment and thought content normal.   I personally reviewed and interpreted images  brought in today by patient on discs. L elbow x-rays reveal fragmented bone spur of olecranon process    Assessment & Plan:  1. L Elbow pain  Likely 2/2 olecranon process fragmented bone spur - Will bring back for ultrasound to look for possible tendon injury  2. 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  Patient interested in 6 week trial to see if it helps.  3. Bilateral foot pain Likely plantar fasciitis - Advised to bring in work boots to follow up appointment to fit for insoles  4. Right shoulder pain Hx of bilateral labral tears - Will have fill out release of records paper today to obtain MRI from 2010 per patient report  Follow up in 1 month for ultrasound of left elbow and for orthotics for his work boots.  Addendum: [facility] has purged the MRI from 2010. There is no report available for review.

## 2016-11-04 ENCOUNTER — Ambulatory Visit: Payer: 59 | Admitting: Sports Medicine

## 2016-11-12 DIAGNOSIS — Z0100 Encounter for examination of eyes and vision without abnormal findings: Secondary | ICD-10-CM | POA: Diagnosis not present

## 2017-11-24 DIAGNOSIS — Z0100 Encounter for examination of eyes and vision without abnormal findings: Secondary | ICD-10-CM | POA: Diagnosis not present

## 2017-11-28 DIAGNOSIS — Z1211 Encounter for screening for malignant neoplasm of colon: Secondary | ICD-10-CM | POA: Diagnosis not present

## 2017-11-28 DIAGNOSIS — Z0000 Encounter for general adult medical examination without abnormal findings: Secondary | ICD-10-CM | POA: Diagnosis not present

## 2017-11-28 DIAGNOSIS — M79641 Pain in right hand: Secondary | ICD-10-CM | POA: Diagnosis not present

## 2017-11-28 DIAGNOSIS — Z23 Encounter for immunization: Secondary | ICD-10-CM | POA: Diagnosis not present

## 2017-12-06 DIAGNOSIS — Z0000 Encounter for general adult medical examination without abnormal findings: Secondary | ICD-10-CM | POA: Diagnosis not present

## 2018-10-09 DIAGNOSIS — J301 Allergic rhinitis due to pollen: Secondary | ICD-10-CM | POA: Diagnosis not present

## 2018-10-09 DIAGNOSIS — K219 Gastro-esophageal reflux disease without esophagitis: Secondary | ICD-10-CM | POA: Diagnosis not present

## 2018-10-13 DIAGNOSIS — H5213 Myopia, bilateral: Secondary | ICD-10-CM | POA: Diagnosis not present

## 2019-04-17 ENCOUNTER — Encounter: Payer: Self-pay | Admitting: Gastroenterology

## 2019-05-21 ENCOUNTER — Ambulatory Visit (AMBULATORY_SURGERY_CENTER): Payer: Self-pay | Admitting: *Deleted

## 2019-05-21 ENCOUNTER — Other Ambulatory Visit: Payer: Self-pay

## 2019-05-21 VITALS — Temp 97.1°F | Ht 72.0 in | Wt 206.0 lb

## 2019-05-21 DIAGNOSIS — Z1211 Encounter for screening for malignant neoplasm of colon: Secondary | ICD-10-CM

## 2019-05-21 DIAGNOSIS — Z01818 Encounter for other preprocedural examination: Secondary | ICD-10-CM

## 2019-05-21 MED ORDER — NA SULFATE-K SULFATE-MG SULF 17.5-3.13-1.6 GM/177ML PO SOLN: 1.0000 | Freq: Once | ORAL | 0 refills | Status: AC

## 2019-05-21 NOTE — Progress Notes (Signed)

## 2019-05-30 ENCOUNTER — Other Ambulatory Visit: Payer: Self-pay

## 2019-05-30 ENCOUNTER — Other Ambulatory Visit: Payer: Self-pay | Admitting: Gastroenterology

## 2019-05-30 ENCOUNTER — Ambulatory Visit (INDEPENDENT_AMBULATORY_CARE_PROVIDER_SITE_OTHER): Payer: 59

## 2019-05-30 DIAGNOSIS — Z1159 Encounter for screening for other viral diseases: Secondary | ICD-10-CM | POA: Diagnosis not present

## 2019-05-30 MED FILL — SUPREP BOWEL PREP KIT: 17.5-3.13-1 | 1 days supply | Qty: 354 | Fill #0

## 2019-05-31 LAB — SARS CORONAVIRUS 2 (TAT 6-24 HRS): SARS Coronavirus 2: NEGATIVE

## 2019-06-04 ENCOUNTER — Other Ambulatory Visit (INDEPENDENT_AMBULATORY_CARE_PROVIDER_SITE_OTHER): Payer: 59

## 2019-06-04 ENCOUNTER — Encounter: Payer: Self-pay | Admitting: Gastroenterology

## 2019-06-04 ENCOUNTER — Ambulatory Visit (AMBULATORY_SURGERY_CENTER): Payer: 59 | Admitting: Gastroenterology

## 2019-06-04 ENCOUNTER — Other Ambulatory Visit: Payer: Self-pay

## 2019-06-04 VITALS — BP 123/80 | HR 66 | Temp 97.1°F | Resp 17 | Ht 72.0 in | Wt 212.0 lb

## 2019-06-04 DIAGNOSIS — C187 Malignant neoplasm of sigmoid colon: Secondary | ICD-10-CM

## 2019-06-04 DIAGNOSIS — Z1211 Encounter for screening for malignant neoplasm of colon: Secondary | ICD-10-CM

## 2019-06-04 DIAGNOSIS — D120 Benign neoplasm of cecum: Secondary | ICD-10-CM

## 2019-06-04 LAB — COMPREHENSIVE METABOLIC PANEL
ALT: 39 U/L (ref 0–53)
AST: 37 U/L (ref 0–37)
Albumin: 4.5 g/dL (ref 3.5–5.2)
Alkaline Phosphatase: 42 U/L (ref 39–117)
BUN: 6 mg/dL (ref 6–23)
CO2: 26 mEq/L (ref 19–32)
Calcium: 8.7 mg/dL (ref 8.4–10.5)
Chloride: 102 mEq/L (ref 96–112)
Creatinine, Ser: 0.86 mg/dL (ref 0.40–1.50)
GFR: 93.02 mL/min (ref >60.00)
Glucose, Bld: 88 mg/dL (ref 70–99)
Potassium: 4.2 mEq/L (ref 3.5–5.1)
Sodium: 136 mEq/L (ref 135–145)
Total Bilirubin: 0.7 mg/dL (ref 0.2–1.2)
Total Protein: 6.9 g/dL (ref 6.0–8.3)

## 2019-06-04 LAB — CBC
HCT: 44.2 % (ref 39.0–52.0)
Hemoglobin: 14.7 g/dL (ref 13.0–17.0)
MCHC: 33.2 g/dL (ref 30.0–36.0)
MCV: 99.1 fl (ref 78.0–100.0)
Platelets: 230 10*3/uL (ref 150.0–400.0)
RBC: 4.46 Mil/uL (ref 4.22–5.81)
RDW: 14.3 % (ref 11.5–15.5)
WBC: 6.5 10*3/uL (ref 4.0–10.5)

## 2019-06-04 MED ORDER — SODIUM CHLORIDE 0.9 % IV SOLN: 500.0000 mL | Freq: Once | INTRAVENOUS | Status: DC

## 2019-06-04 NOTE — Patient Instructions (Signed)
YOU HAD AN ENDOSCOPIC PROCEDURE TODAY AT [facility]:   Refer to the procedure report that was given to you for any specific questions about what was found during the examination.  If the procedure report does not answer your questions, please call your gastroenterologist to clarify.  If you requested that your care partner not be given the details of your procedure findings, then the procedure report has been included in a sealed envelope for you to review at your convenience later.  YOU SHOULD EXPECT: Some feelings of bloating in the abdomen. Passage of more gas than usual.  Walking can help get rid of the air that was put into your GI tract during the procedure and reduce the bloating. If you had a lower endoscopy (such as a colonoscopy or flexible sigmoidoscopy) you may notice spotting of blood in your stool or on the toilet paper. If you underwent a bowel prep for your procedure, you may not have a normal bowel movement for a few days.  Please Note:  You might notice some irritation and congestion in your nose or some drainage.  This is from the oxygen used during your procedure.  There is no need for concern and it should clear up in a day or so.  SYMPTOMS TO REPORT IMMEDIATELY:   Following lower endoscopy (colonoscopy or flexible sigmoidoscopy):  Excessive amounts of blood in the stool  Significant tenderness or worsening of abdominal pains  Swelling of the abdomen that is new, acute  Fever of 100F or higher   For urgent or emergent issues, a gastroenterologist can be reached at any hour by calling [phone]. Do not use MyChart messaging for urgent concerns.    DIET:  We do recommend a small meal at first, but then you may proceed to your regular diet.  Drink plenty of fluids but you should avoid alcoholic beverages for 24 hours.  MEDICATIONS: Continue present medications.  Follow Up: Have labs drawn for BUN/Creatinine prior to discharge.  Follow Up: Have CT of  chest, abdomen, and pelvis with contrast. Your recovery nurse has given you 2 bottles of Readi-Cat (oral contrast media) prior to discharge as you will need this for the CT scan. Dr. Woodward Ku office nurse will call you to schedule this appointment and give you instructions for CT scan.  Please see handouts given to you by your recovery nurse.  ACTIVITY:  You should plan to take it easy for the rest of today and you should NOT DRIVE or use heavy machinery until tomorrow (because of the sedation medicines used during the test).    FOLLOW UP: Our staff will call the number listed on your records 48-72 hours following your procedure to check on you and address any questions or concerns that you may have regarding the information given to you following your procedure. If we do not reach you, we will leave a message.  We will attempt to reach you two times.  During this call, we will ask if you have developed any symptoms of COVID 19. If you develop any symptoms (ie: fever, flu-like symptoms, shortness of breath, cough etc.) before then, please call [phone].  If you test positive for Covid 19 in the 2 weeks post procedure, please call and report this information to [place].    If any biopsies were taken you will be contacted by phone or by letter within the next 1-3 weeks.  Please call us at [phone] if you have not heard about the  biopsies in 3 weeks.   Thank you for allowing [place] to provide for your healthcare needs today.   SIGNATURES/CONFIDENTIALITY: You and/or your care partner have signed paperwork which will be entered into your electronic medical record.  These signatures attest to the fact that that the information above on your After Visit Summary has been reviewed and is understood.  Full responsibility of the confidentiality of this discharge information lies with you and/or your care-partner.

## 2019-06-04 NOTE — Progress Notes (Signed)
Called to room to assist during endoscopic procedure.  Patient ID and intended procedure confirmed with present staff. Received instructions for my participation in the procedure from the performing physician.  

## 2019-06-04 NOTE — Progress Notes (Signed)
A and O x3. Report to RN. Tolerated MAC anesthesia well.

## 2019-06-04 NOTE — Op Note (Addendum)
[name] Patient Name: Brian Avery Procedure Date: 06/04/2019 10:51 AM MRN: [id number] Endoscopist: Mauri Pole , MD Age: 54 Referring MD:  Date of Birth: 01-31-1966 Gender: Male Account #: [id number] Procedure:                Colonoscopy Indications:              Screening for colorectal malignant neoplasm Medicines:                Monitored Anesthesia Care Procedure:                Pre-Anesthesia Assessment:                           - Prior to the procedure, a History and Physical                            was performed, and patient medications and                            allergies were reviewed. The patient's tolerance of                            previous anesthesia was also reviewed. The risks                            and benefits of the procedure and the sedation                            options and risks were discussed with the patient.                            All questions were answered, and informed consent                            was obtained. Prior Anticoagulants: The patient has                            taken no previous anticoagulant or antiplatelet                            agents. ASA Grade Assessment: II - A patient with                            mild systemic disease. After reviewing the risks                            and benefits, the patient was deemed in                            satisfactory condition to undergo the procedure.                           After obtaining informed consent, the colonoscope  was passed under direct vision. Throughout the                            procedure, the patient's blood pressure, pulse, and                            oxygen saturations were monitored continuously. The                            Colonoscope was introduced through the anus and                            advanced to the the cecum, identified by                            appendiceal orifice and  ileocecal valve. The                            colonoscopy was performed without difficulty. The                            patient tolerated the procedure well. The quality                            of the bowel preparation was fair. The ileocecal                            valve, appendiceal orifice, and rectum were                            photographed. Scope In: 11:09:01 AM Scope Out: 11:36:26 AM Scope Withdrawal Time: 0 hours 21 minutes 48 seconds  Total Procedure Duration: 0 hours 27 minutes 25 seconds  Findings:                 The perianal and digital rectal examinations were                            normal.                           A 15 mm polyp was found in the cecum. The polyp was                            flat. The polyp was removed with a piecemeal                            technique using a cold snare. Resection and                            retrieval were complete.                           An infiltrative partially obstructing large mass  was found in the sigmoid colon. The mass was                            partially circumferential (involving one-half of                            the lumen circumference). The mass measured five cm                            in length extending from 28cm to 33cm from anal                            verge. Biopsies were taken with a cold forceps for                            histology. Distal fold area was tattooed with an                            injection of 2 mL of Spot (carbon black).                           A few small-mouthed diverticula were found in the                            sigmoid colon.                           Non-bleeding internal hemorrhoids were found during                            retroflexion. The hemorrhoids were small. Complications:            No immediate complications. Estimated Blood Loss:     Estimated blood loss was minimal. Impression:               -  Preparation of the colon was fair.                           - One 15 mm polyp in the cecum, removed piecemeal                            using a cold snare. Resected and retrieved.                           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 Biopsied. Tattooed.                           - Diverticulosis in the sigmoid colon.                           - Non-bleeding internal hemorrhoids. Recommendation:           -  Patient has a contact number available for                            emergencies. The signs and symptoms of potential                            delayed complications were discussed with the                            patient. Return to normal activities tomorrow.                            Written discharge instructions were provided to the                            patient.                           - Resume previous diet.                           - Continue present medications.                           - Await pathology results.                           - CBC and CMP                           - CT Chest, abdomen and pelvis with contrast                           - Repeat colonoscopy in 1 year for surveillance                            based on pathology results. Mauri Pole, MD 06/04/2019 11:45:58 AM This report has been signed electronically.

## 2019-06-05 ENCOUNTER — Ambulatory Visit (INDEPENDENT_AMBULATORY_CARE_PROVIDER_SITE_OTHER)
Admission: RE | Admit: 2019-06-05 | Discharge: 2019-06-05 | Disposition: A | Discharge status: Home / Self Care | Payer: 59 | Source: Ambulatory Visit | Attending: Gastroenterology | Admitting: Gastroenterology

## 2019-06-05 ENCOUNTER — Telehealth: Payer: Self-pay | Admitting: Gastroenterology

## 2019-06-05 DIAGNOSIS — C187 Malignant neoplasm of sigmoid colon: Secondary | ICD-10-CM | POA: Diagnosis not present

## 2019-06-05 MED ORDER — IOHEXOL 300 MG/ML  SOLN
100.0000 mL | Freq: Once | INTRAMUSCULAR | Status: AC | PRN
  Administered 2019-06-05: 100 mL via INTRAVENOUS

## 2019-06-05 NOTE — Telephone Encounter (Signed)
Brian Avery from [facility] pathology requested to speak to Dr. Silverio Decamp regarding this pt.

## 2019-06-06 ENCOUNTER — Telehealth: Payer: Self-pay

## 2019-06-06 ENCOUNTER — Other Ambulatory Visit: Payer: Self-pay

## 2019-06-06 DIAGNOSIS — C187 Malignant neoplasm of sigmoid colon: Secondary | ICD-10-CM

## 2019-06-06 NOTE — Telephone Encounter (Signed)
Please see result note for details.  Thanks

## 2019-06-06 NOTE — Telephone Encounter (Signed)
  Follow up Call-  Call back number 06/04/2019  Post procedure Call Back phone  # [phone]  Permission to leave phone message Yes  Some recent data might be hidden     Patient questions:  Do you have a fever, pain , or abdominal swelling? No. Pain Score  0 *  Have you tolerated food without any problems? Yes.    Have you been able to return to your normal activities? Yes.    Do you have any questions about your discharge instructions: Diet   No. Medications  No. Follow up visit  No.  Do you have questions or concerns about your Care? No.  Actions: * If pain score is 4 or above: No action needed, pain <4.  Have you developed a fever since your procedure? no 2.   Have you had an respiratory symptoms (SOB or cough) since your procedure? no  3.   Have you tested positive for COVID 19 since your procedure no  4.   Have you had any family members/close contacts diagnosed with the COVID 19 since your procedure?  no   If yes to any of these questions please route to Joylene John, RN and Erenest Rasher, RN

## 2019-06-06 NOTE — Telephone Encounter (Signed)
Received appointment notice from [facility]. Patient will see Dr Dema Severin 06/11/19 at 1:30 pm Confirmed with the patient.  He states he has spoken with the surgeon's office. He has returned a call to [facility] as well. He will call us with any issues.

## 2019-06-06 NOTE — Telephone Encounter (Signed)
Epic referral to [facility] marked as Urgent Records faxed to [facility] marked as "Urgent referral"

## 2019-06-11 DIAGNOSIS — C189 Malignant neoplasm of colon, unspecified: Secondary | ICD-10-CM | POA: Diagnosis not present

## 2019-06-11 MED FILL — NEOMYCIN 500 MG TABLET: 500 | 1 days supply | Qty: 6 | Fill #0

## 2019-06-11 MED FILL — metroNIDAZOLE 500 MG TABS: 500 | 1 days supply | Qty: 6 | Fill #0

## 2019-06-13 NOTE — Progress Notes (Signed)
Spoke with patient regarding his upcoming appointment with Dr. Benay Spice on 4/29 at 2 pm.  I explained my role as GI nurse navigator.  I asked that he arrive at least 15 minutes prior to his appointment for registration.  I explained there is valet parking he can utilize as well.  He saw Dr. Dema Severin on 4/26 and is waiting to hear back when he is surgery might be scheduled.  He verbalized an understanding of all information given and his questions were answered.

## 2019-06-14 ENCOUNTER — Encounter: Payer: Self-pay | Admitting: *Deleted

## 2019-06-14 ENCOUNTER — Inpatient Hospital Stay: Payer: 59 | Attending: Oncology | Admitting: Oncology

## 2019-06-14 ENCOUNTER — Other Ambulatory Visit: Payer: Self-pay

## 2019-06-14 ENCOUNTER — Telehealth: Payer: Self-pay | Admitting: Oncology

## 2019-06-14 VITALS — BP 141/88 | HR 76 | Temp 98.5°F | Resp 17 | Ht 72.0 in | Wt 209.7 lb

## 2019-06-14 DIAGNOSIS — R590 Localized enlarged lymph nodes: Secondary | ICD-10-CM | POA: Insufficient documentation

## 2019-06-14 DIAGNOSIS — C187 Malignant neoplasm of sigmoid colon: Secondary | ICD-10-CM | POA: Insufficient documentation

## 2019-06-14 DIAGNOSIS — Z79899 Other long term (current) drug therapy: Secondary | ICD-10-CM | POA: Insufficient documentation

## 2019-06-14 DIAGNOSIS — Z800 Family history of malignant neoplasm of digestive organs: Secondary | ICD-10-CM | POA: Insufficient documentation

## 2019-06-14 DIAGNOSIS — D120 Benign neoplasm of cecum: Secondary | ICD-10-CM | POA: Diagnosis not present

## 2019-06-14 DIAGNOSIS — Z803 Family history of malignant neoplasm of breast: Secondary | ICD-10-CM | POA: Insufficient documentation

## 2019-06-14 DIAGNOSIS — D125 Benign neoplasm of sigmoid colon: Secondary | ICD-10-CM | POA: Insufficient documentation

## 2019-06-14 NOTE — Progress Notes (Signed)
Wife brought in [place] forms for her to begin on 07/23/19 from [employer]. Forwarded to [place], [place]

## 2019-06-14 NOTE — Telephone Encounter (Signed)
Scheduled appt per 4/29 sch message  And LOS - pt aware of appts

## 2019-06-14 NOTE — Progress Notes (Signed)
Met with patient and his wife Brian Avery at their initial medical oncology appointment with Dr. Benay Spice.  Explained my role as GI navigator and they were given my direct phone number to call with any questions or concerns.  They are received a referral to SW and nutrition consult as well.  Dr. Dema Severin had referred him to Genetics and I will follow up on this.  He is scheduled for surgery on 6/7 and is concerned about the wait.  Dr. Benay Spice will message the surgeon to see if it can be done any sooner.  The patient's wife Brian Avery left FMLA papers for her job and these were sent to the appropriate party.  All of their questions were answered by Dr. Benay Spice and they know to contact me directly with any questions or concerns that arise.

## 2019-06-14 NOTE — Progress Notes (Signed)
[facility] Patient Consult   Requesting MD: Alessander Ta 54 y.o.  1965/03/26    Reason for Consult: Colon cancer   HPI: Mr. Brian Avery underwent a screening colonoscopy by Dr. Silverio Decamp on 06/04/2019.  A 15 mm polyp was removed from the cecum.  A partially obstructing mass was found in the sigmoid colon.  The mass was noted at 28-33 cm from the anal verge.  Biopsies were obtained and the area was tattooed.  Diverticula were noted in the sigmoid colon.  Internal hemorrhoids were found during retroflexion.  The pathology returned with a sessile serrated adenoma involving the cecum polyp.  The sigmoid colon biopsy returned as adenocarcinoma. CTs on 06/05/2019 revealed eccentric wall thickening at the proximal sigmoid colon.  Prominent subcentimeter lymph nodes in the adjacent mesocolon.  Prominent nonspecific retroperitoneal nodes, largest on the left measuring 1 x 1 cm.  No other evidence of metastatic disease.  He saw Dr. Dema Severin on 06/11/2019 and is being scheduled for surgery.  Past Medical History:  Diagnosis Date  . Arthritis   . 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 KNEE ARTHROSCOPY  2010   right  . KNEE ARTHROSCOPY Right 01/30/2016   Procedure: ARTHROSCOPY KNEE;  Surgeon: Netta Cedars, MD;  Location: [facility];  Service: Orthopedics;  Laterality: Right;  . NECK SURGERY-approximately 20 years ago     lymphnode removed, he reports the lymph node was "reactive "and did not reveal sarcoid    Medications: Reviewed  Allergies:  Allergies  Allergen Reactions  . No Known Allergies     Family history: His sister was diagnosed with ovarian cancer at age 28.  His mother is age 45 and has a history of bilateral breast cancer.  His maternal grandfather had a gastrointestinal cancer.  He has 3 sisters and 1 brother  Social History:   He lives with his wife and  [place].  He is an Brewing technologist.  He is not use cigarettes.  He reports moderate alcohol use.  No transfusion history.  No risk factor for HIV or hepatitis.  ROS:   Positives include: Occasional "spot "of blood on toilet paper  A complete ROS was otherwise negative.  Physical Exam:  Blood pressure (!) 141/88, pulse 76, temperature 98.5 F (36.9 C), temperature source Temporal, resp. rate 17, height 6' (1.829 m), weight 209 lb 11.2 oz (95.1 kg), SpO2 100 %.  HEENT: Neck without mass Lungs: Clear bilaterally Cardiac: Regular rate and rhythm Abdomen: No hepatosplenomegaly, no mass, nontender GU: Testes without mass Vascular: No leg edema Lymph nodes: No cervical, supraclavicular, axillary, or inguinal nodes Neurologic: Alert and oriented, motor exam appears intact in the upper and lower extremities bilaterally Skin: No nodular skin lesions, insect bite at the upper abdominal wall, few erythematous pustular areas over the upper arms Musculoskeletal: No spine tenderness   LAB:  CBC  Lab Results  Component Value Date   WBC 6.5 06/04/2019   HGB 14.7 06/04/2019   HCT 44.2 06/04/2019   MCV 99.1 06/04/2019   PLT 230.0 06/04/2019        CMP  Lab Results  Component Value Date   NA 136 06/04/2019   K 4.2 06/04/2019   CL 102 06/04/2019   CO2 26 06/04/2019   GLUCOSE 88 06/04/2019   BUN 6 06/04/2019   CREATININE 0.86 06/04/2019   CALCIUM 8.7 06/04/2019  PROT 6.9 06/04/2019   ALBUMIN 4.5 06/04/2019   AST 37 06/04/2019   ALT 39 06/04/2019   ALKPHOS 42 06/04/2019   BILITOT 0.7 06/04/2019   GFRNONAA >60 01/22/2016   GFRAA >60 01/22/2016     Imaging: As per HPI, CT images from 06/05/2019 reviewed with Mr. Whisenhunt and his wife    Assessment/Plan:   1. Sigmoid colon cancer  Colonoscopy 06/04/2019-cecum polyp, mass in the sigmoid colon beginning at 28 cm, biopsy confirmed a sessile serrated adenoma of the cecum, adenocarcinoma at the sigmoid colon  CTs  06/05/2019-eccentric wall thickening of the proximal sigmoid colon with subcentimeter lymph nodes in the adjacent mesocolon, prominent nonspecific retroperitoneal nodes-largest 1 x 1 cm 2. Remote history of sarcoidosis-pulmonary and skin, he reports treatment with prednisone with resolution 3. Family history of multiple cancers including breast, ovarian, and a "gastrointestinal "malignancy   Disposition:   Mr. Davidovich has been diagnosed with colon cancer.  I discussed the standard treatment of colon cancer with Mr. Mattaliano and his wife.  He understands recommendations for adjuvant therapy will depend on the surgical pathology.  There is no clinical evidence of metastatic disease.  I suspect the small retroperitoneal lymph nodes are a benign finding, potentially related to sarcoidosis.  I will present his case at the GI tumor conference and consider additional evaluation if recommended.  He has a significant family history of cancer.  He has been referred to the genetics counselor.  He most likely does not have hereditary nonpolyposis colon cancer syndrome, but his family members are at increased risk of developing colorectal cancer and should receive appropriate screening.  I will see him after surgery to discuss the surgical pathology and adjuvant treatment options.  Betsy Coder, MD  06/14/2019, 4:32 PM

## 2019-06-15 ENCOUNTER — Ambulatory Visit: Payer: Self-pay | Admitting: Surgery

## 2019-06-15 NOTE — H&P (Signed)
CC: Referred for newly diagnosed colon cancer  HPI: Brian Avery is a very pleasant 54yoM who presents for evaluation of a newly diagnosed colon cancer. He reports occasional spots of blood with bowel movements that he has attributed to internal hemorrhoids. He underwent his first colonoscopy 06/04/19 with Dr. Silverio Decamp. This is notable for a 15 mm polyp in the cecum that was removed in a piecemeal manner. A mass in the sigmoid colon was partially circumferential measuring 5 cm in length-28-33 cm from the verge. Biopsy and tattooed (distally). Biopsies returned adenocarcinoma. Cecal polyp was a sessile serrated adenoma. He underwent staging CT chest/abdomen/pelvis 06/05/19 which demonstrates eccentric wall thickening of the proximal sigmoid colon in keeping with the reported mass. Prominent subcentimeter lymph nodes in the adjacent mesocolon up to 5 mm. Prominent nonspecific retroperitoneal lymph nodes measuring 1 x 1 cm. No other evidence of metastatic disease. He denies any specific symptoms related to this site from the occasional drops of blood. He denies any nausea/vomiting/abdominal pain.  PMH: Denies  PSH: Denies any prior abdominal or pelvic surgical history. Reports having had a benign/reactive lymph node removed from his neck many years ago  FHx: Mother with breast cancer recently and aunt on same side with ovarian cancer.  Social: Denies use of tobacco/drugs; occasional social etoh use. 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   The patient is a 54 year old male.   Past Surgical History Geni Bers [place], RMA; 06/11/2019 1:32 PM) Knee Surgery  Bilateral.  Diagnostic Studies History [facility], RMA; 06/11/2019 1:32  PM) Colonoscopy  within last year  Allergies Geni Bers [place], RMA; 06/11/2019 1:32 PM) No Known Drug Allergies  [06/11/2019]: Allergies Reconciled   Medication History [employer], RMA; 06/11/2019 1:32 PM) No Current Medications Medications Reconciled  Social History Geni Bers [place], RMA; 06/11/2019 1:32 PM) Alcohol use  Occasional alcohol use. Caffeine use  Carbonated beverages, Coffee. No drug use  Tobacco use  Never smoker.  Family History Geni Bers [place], RMA; 06/11/2019 1:32 PM) Alcohol Abuse  Father. Bleeding disorder  Brother, Mother. Breast Cancer  Mother. Cerebrovascular Accident  Father. Colon Polyps  Father. Heart Disease  Father. Hypertension  Father. Migraine Headache  Sister. Ovarian Cancer  Sister. Respiratory Condition  Mother. Thyroid problems  Mother.  Other Problems Geni Bers [place], RMA; 06/11/2019 1:32 PM) Colon Cancer     Review of Systems (Jacqueline Haggett RMA; 06/11/2019 1:32 PM) General Not Present- Appetite Loss, Chills, Fatigue, Fever, Night Sweats, Weight Gain and Weight Loss. Skin Not Present- Change in Wart/Mole, Dryness, Hives, Jaundice, New Lesions, Non-Healing Wounds, Rash and Ulcer. HEENT Not Present- Earache, Hearing Loss, Hoarseness, Nose Bleed, Oral Ulcers, Ringing in the Ears, Seasonal Allergies, Sinus Pain, Sore Throat, Visual Disturbances, Wears glasses/contact lenses and Yellow Eyes. Respiratory Not Present- Bloody sputum, Chronic Cough, Difficulty Breathing, Snoring and Wheezing. Breast Not Present- Breast Mass, Breast Pain, Nipple Discharge and Skin Changes. Cardiovascular Not Present- Chest Pain, Difficulty Breathing Lying Down, Leg Cramps, Palpitations, Rapid Heart Rate, Shortness of Breath and Swelling of Extremities. Gastrointestinal Not Present- Abdominal Pain, Bloating, Bloody Stool, Change in Bowel Habits, Chronic diarrhea, Constipation, Difficulty Swallowing, Excessive gas, Gets full  quickly at meals, Hemorrhoids, Indigestion, Nausea, Rectal Pain and Vomiting. Male Genitourinary Not Present- Blood in Urine, Change  in Urinary Stream, Frequency, Impotence, Nocturia, Painful Urination, Urgency and Urine Leakage. Musculoskeletal Not Present- Back Pain, Joint Pain, Joint Stiffness, Muscle Pain, Muscle Weakness and Swelling of Extremities. Neurological Not Present- Decreased Memory, Fainting, Headaches, Numbness, Seizures, Tingling, Tremor, Trouble walking and Weakness. Psychiatric Not Present- Anxiety, Bipolar, Change in Sleep Pattern, Depression, Fearful and Frequent crying. Endocrine Not Present- Cold Intolerance, Excessive Hunger, Hair Changes, Heat Intolerance, Hot flashes and New Diabetes. Hematology Not Present- Blood Thinners, Easy Bruising, Excessive bleeding, Gland problems, HIV and Persistent Infections.  Vitals [employer] Haggett RMA; 06/11/2019 1:33 PM) 06/11/2019 1:32 PM Weight: 210.6 lb Height: 71.5in Body Surface Area: 2.17 m Body Mass Index: 28.96 kg/m  Temp.: 98.31F (Temporal)  Pulse: 70 (Regular)  P.OX: 99% (Room air) BP: 160/90(Sitting, Right Arm, Standard)       Physical Exam Harrell Gave M. Alp Goldwater MD; 06/11/2019 2:16 PM) The physical exam findings are as follows: Note: Constitutional: No acute distress; conversant; no deformities Eyes: Moist conjunctiva; no lid lag; anicteric sclerae; pupils equal and round Neck: Trachea midline; no palpable thyromegaly Lungs: Normal respiratory effort CV: rrr; no palpable thrill; no pitting edema GI: Abdomen soft, nontender, nondistended; no palpable hepatosplenomegaly MSK: Normal gait; no clubbing/cyanosis Psychiatric: Appropriate affect; alert and oriented 3 **A chaperone, [employer], was present for this encounter    Assessment & Plan Harrell Gave M. Essica Kiker MD; 06/11/2019 2:19 PM) COLON CANCER (C18.9) Story: Brian Avery is a very pleasant 54yoM with newly diagnosed proximal sigmoid colon  cancer - CTs show suspicious lymph nodes in mesentery and additionally in retroperitoneum Impression:  -Case has been submitted to review at MDT conference -Genetics referral given family history as well -The anatomy and physiology of the GI tract was discussed at length with the patient and his wife today. The pathophysiology of colon cancer was discussed at length with associated pictures as well. -We reviewed robotic sigmoidectomy/left hemicolectomy; flexible sigmoidoscopy; potential open techniques as well. -The planned procedure, material risks (including, but not limited to, pain, bleeding, infection, scarring, need for blood transfusion, damage to surrounding structures- blood vessels/nerves/viscus/organs, damage to ureter, urine leak, leak from anastomosis, need for additional procedures, worsening of pre-existing medical conditions, need for stoma which may be permanent, hernia, recurrence of cancer, DVT/PE, pneumonia, heart attack, stroke, death) benefits and alternatives to surgery were discussed at length. I noted a good probability that the procedure would help improve their symptoms. The patient's questions were answered to his satisfaction, he voiced understanding and elected to proceed with surgery. Additionally, we discussed typical postoperative expectations and the recovery process. We also reviewed potential surveillance algorithms as per NCCN and potential indications for chemotherapy as well  This patient encounter took 55 minutes today to perform the following: take history, perform exam, review outside records, interpret imaging, counsel the patient on their diagnosis and document encounter, findings & plan in the EHR  Signed by Ileana Roup, MD (06/11/2019 2:20 PM)

## 2019-06-19 ENCOUNTER — Inpatient Hospital Stay: Payer: 59 | Attending: Oncology | Admitting: Nutrition

## 2019-06-19 ENCOUNTER — Telehealth: Payer: Self-pay | Admitting: Nutrition

## 2019-06-19 NOTE — Progress Notes (Signed)
See telephone note.

## 2019-06-19 NOTE — Telephone Encounter (Signed)
Patient is a 54 year old man with newly diagnosed colon cancer who is scheduled for robotic sigmoidectomy and left hemicolectomy with possible chemotherapy.  Patient interested in information on good nutrition pre and postop.  Reports he generally eats a healthy diet with lots of fruits and vegetables and lean sources of protein such as fish and chicken.  He has no nutrition impact symptoms. Educated patient on healthy plant-based diet with increased fruits vegetables and whole grains.  Reviewed healthy lean protein foods.  Suggested patient focus on weight maintenance.  Reviewed importance of protein for healing.  Questions were answered.  Teach back method used.  Patient was given the name and phone number for further questions.

## 2019-06-20 ENCOUNTER — Other Ambulatory Visit: Payer: Self-pay

## 2019-06-20 ENCOUNTER — Inpatient Hospital Stay: Payer: 59

## 2019-06-20 ENCOUNTER — Inpatient Hospital Stay: Payer: 59 | Admitting: Genetic Counselor

## 2019-06-20 ENCOUNTER — Ambulatory Visit: Payer: Self-pay | Admitting: Surgery

## 2019-06-20 ENCOUNTER — Encounter: Payer: Self-pay | Admitting: General Practice

## 2019-06-20 NOTE — Progress Notes (Addendum)
[facility] Psychosocial Distress Screening Clinical Social Work  Clinical Social Work was referred by distress screening protocol.  The patient scored a 6 on the Psychosocial Distress Thermometer which indicates moderate distress. Clinical Social Worker contacted patient by phone to assess for distress and other psychosocial needs.  Patient returned my call, is doing quite well and is awaiting surgery in June.  No concerns at this time, he will contact me as needs arise in the future.  ONCBCN DISTRESS SCREENING 06/14/2019  Screening Type Initial Screening  Distress experienced in past week (1-10) 6  Emotional problem type Adjusting to illness  Information Concerns Type Lack of info about diagnosis;Lack of info about treatment  Referral to clinical social work Yes  Referral to dietition Yes    Clinical Social Worker follow up needed: No.  If yes, follow up plan:    Beverely Pace, [place], [place] Social Worker Phone:  [phone] Cell:  [phone]

## 2019-06-20 NOTE — Progress Notes (Signed)
[facility] Psychosocial Distress Screening Clinical Social Work  Clinical Social Work was referred by distress screening protocol.  The patient scored a 6 on the Psychosocial Distress Thermometer which indicates moderate distress. Clinical Social Worker contacted patient by phone to assess for distress and other psychosocial needs. Unable to reach patient, left VM w my contact information and request for return call as well as brief description of [facility] services.  Will try again to contact him as well.      Clinical Social Worker follow up needed: Yes.    If yes, follow up plan:  recontact patient  Beverely Pace, LCSW

## 2019-06-22 ENCOUNTER — Telehealth: Payer: Self-pay | Admitting: *Deleted

## 2019-06-22 NOTE — Telephone Encounter (Signed)
Connected with Juan Quam [phone]).  Notified form received 06/14/2019 was completed yesterday.  Will fax to [employer] upon provider review and signature.  FMLA may need updating with oncology treatment plan pending surgery.    Denies further questions or needs currently.

## 2019-06-22 NOTE — Telephone Encounter (Signed)
-----   Message from Jonnie Finner, RN sent at 06/19/2019 11:03 AM EDT ----- Regarding: [employer] FMLA paperwork Nanine Means is inquiring about her FMLA paperwork.  Can you reach out to her?  Her number is in the chart.  Thanks [employer]

## 2019-06-27 ENCOUNTER — Other Ambulatory Visit: Payer: Self-pay | Admitting: Genetic Counselor

## 2019-06-27 ENCOUNTER — Inpatient Hospital Stay: Payer: 59

## 2019-06-27 ENCOUNTER — Encounter: Payer: Self-pay | Admitting: Genetic Counselor

## 2019-06-27 ENCOUNTER — Inpatient Hospital Stay (HOSPITAL_BASED_OUTPATIENT_CLINIC_OR_DEPARTMENT_OTHER): Payer: 59 | Admitting: Genetic Counselor

## 2019-06-27 ENCOUNTER — Other Ambulatory Visit: Payer: Self-pay

## 2019-06-27 DIAGNOSIS — Z800 Family history of malignant neoplasm of digestive organs: Secondary | ICD-10-CM | POA: Diagnosis not present

## 2019-06-27 DIAGNOSIS — Z8041 Family history of malignant neoplasm of ovary: Secondary | ICD-10-CM | POA: Insufficient documentation

## 2019-06-27 DIAGNOSIS — C189 Malignant neoplasm of colon, unspecified: Secondary | ICD-10-CM | POA: Insufficient documentation

## 2019-06-27 DIAGNOSIS — C187 Malignant neoplasm of sigmoid colon: Secondary | ICD-10-CM

## 2019-06-27 DIAGNOSIS — Z803 Family history of malignant neoplasm of breast: Secondary | ICD-10-CM | POA: Insufficient documentation

## 2019-06-27 DIAGNOSIS — Z8042 Family history of malignant neoplasm of prostate: Secondary | ICD-10-CM | POA: Diagnosis not present

## 2019-06-27 LAB — GENETIC SCREENING ORDER

## 2019-06-27 NOTE — Progress Notes (Signed)
REFERRING PROVIDER: Ladell Pier, MD [address] [place],  [place] [address]  PRIMARY PROVIDER:  Janie Morning, DO  PRIMARY REASON FOR VISIT:  1. Family history of colon cancer   2. Family history of breast cancer   3. Family history of ovarian cancer   4. Malignant neoplasm of sigmoid colon (HCC)      HISTORY OF PRESENT ILLNESS:   Brian Avery, a 54 y.o. male, was seen for a [place] cancer genetics consultation at the request of Dr. Benay Spice due to a personal and family history of colon cancer, and family history of breast, ovarian and prostate cancer.  Brian Avery presents to clinic today to discuss the possibility of a hereditary predisposition to cancer, genetic testing, and to further clarify his future cancer risks, as well as potential cancer risks for family members.   In April 2021, at the age of 16, Brian Avery was diagnosed with cancer of the sigmoid colon. The treatment plan includes surgery and chemotherapy.  Brian Avery has a family history of Factor V Leiden in his mother and brother.     CANCER HISTORY:  Oncology History   No history exists.     Past Medical History:  Diagnosis Date  . Arthritis   . Family history of breast cancer   . Family history of colon cancer   . Family history of ovarian cancer   . Sarcoidosis of lung ([place]) 1998    Past Surgical History:  Procedure Laterality Date  . KNEE ARTHROSCOPY  2010   right  . KNEE ARTHROSCOPY Right 01/30/2016   Procedure: ARTHROSCOPY KNEE;  Surgeon: Netta Cedars, MD;  Location: [facility];  Service: Orthopedics;  Laterality: Right;  . NECK SURGERY     lymphnode removed    Social History   Socioeconomic History  . Marital status: Married    Spouse name: Not on file  . Number of children: Not on file  . Years of education: Not on file  . Highest education level: Not on file  Occupational History  . Not on file  Tobacco Use  . Smoking status: Never Smoker  . Smokeless tobacco: Former  Network engineer and Sexual Activity  . Alcohol use: Yes    Alcohol/week: 4.0 standard drinks    Types: 4 Glasses of wine per week  . Drug use: No  . Sexual activity: Not on file  Other Topics Concern  . Not on file  Social History Narrative  . Not on file   Social Determinants of Health   Financial Resource Strain:   . Difficulty of Paying Living Expenses:   Food Insecurity:   . Worried About Charity fundraiser in the Last Year:   . Arboriculturist in the Last Year:   Transportation Needs:   . Film/video editor (Medical):   Marland Kitchen Lack of Transportation (Non-Medical):   Physical Activity:   . Days of Exercise per Week:   . Minutes of Exercise per Session:   Stress:   . Feeling of Stress :   Social Connections:   . Frequency of Communication with Friends and Family:   . Frequency of Social Gatherings with Friends and Family:   . Attends Religious Services:   . Active Member of Clubs or Organizations:   . Attends Archivist Meetings:   Marland Kitchen Marital Status:      FAMILY HISTORY:  We obtained a detailed, 4-generation family history.  Significant diagnoses are listed below: Family History  Problem Relation Age of Onset  . Breast cancer Mother 16       second dx at 68  . Factor V Leiden deficiency Mother   . Hypertension Father   . Colon cancer Maternal Grandfather 60       d. 58  . Ovarian cancer Sister 53  . Prostate cancer Maternal Uncle 66       d. 76  . Dementia Paternal Uncle   . Heart disease Maternal Grandmother   . Lung cancer Paternal Grandfather   . Colon cancer Maternal Uncle 82  . Factor V Leiden deficiency Maternal Uncle   . Thrombosis Maternal Uncle        due to FVL  . Esophageal cancer Neg Hx   . Stomach cancer Neg Hx   . Rectal cancer Neg Hx     The patient has two sons who are cancer free.  He has three sisters and a brother.  His brother has Factor V Leiden and a sister has a diagnosis of ovarian cancer. His sister had genetic testing,  but the results are not known. The parents are both living.  The patient's mother had bilateral breast cancer at age 61 and 68.  She has two sisters and two brothers.  One brother died of prostate cancer and another brother has colon cancer.  The maternal grandparents are deceased. The grandfather had colon cancer around age 21.  The patients' father was one of 12 children.  He has a few siblings who died of dementia, but no reported cancer.  His parents are deceased, his mother died at 28, and his father died of lung cancer.  Brian Avery is aware of previous family history of genetic testing for hereditary cancer risks. Patient's maternal ancestors are of [place] descent, and paternal ancestors are of [place] descent. There is no reported Ashkenazi Jewish ancestry. There is no known consanguinity.  GENETIC COUNSELING ASSESSMENT: Brian Avery is a 54 y.o. male with a personal and family history of cancer which is somewhat suggestive of a hereditary cancer syndrome, such as Lynch syndrome, and predisposition to cancer given the combination of cancer, multiple generations and multiple diagnosis in family members. We, therefore, discussed and recommended the following at today's visit.   DISCUSSION: We discussed that 5 - 7% of colon cancer is hereditary, with most cases associated with Lynch syndrome.  There are other genes that can be associated with hereditary colon cancer syndromes.  These include CHEK2, APC, and MUTYH. We discussed that based on the combination of breast, ovarian and prostate cancer, there could be a hereditary breast cancer syndrome running in the family that could be due to BRCA, PALB2, CHEK2 or ATM mutations. We discussed that testing is beneficial for several reasons including knowing how to follow individuals after completing their treatment, identifying whether potential treatment options such as Beryle Flock would be beneficial, and understand if other family members  could be at risk for cancer and allow them to undergo genetic testing.   We reviewed the characteristics, features and inheritance patterns of hereditary cancer syndromes. We also discussed genetic testing, including the appropriate family members to test, the process of testing, insurance coverage and turn-around-time for results. We discussed the implications of a negative, positive, carrier and/or variant of uncertain significant result. We recommended Brian Avery pursue genetic testing for the CustomNext-Cancer+RNA gene panel. The CustomNext-Cancer gene panel offered by [facility] and includes sequencing and rearrangement analysis for the following 91 genes: AIP, ALK, APC*, ATM*,  AXIN2, BAP1, BARD1, BLM, BMPR1A, BRCA1*, BRCA2*, BRIP1*, CDC73, CDH1*, CDK4, CDKN1B, CDKN2A, CHEK2*, CTNNA1, DICER1, FANCC, FH, FLCN, GALNT12, KIF1B, LZTR1, MAX, MEN1, MET, MLH1*, MRE11A, MSH2*, MSH3, MSH6*, MUTYH*, NBN, NF1*, NF2, NTHL1, PALB2*, PHOX2B, PMS2*, POT1, PRKAR1A, PTCH1, PTEN*, RAD50, RAD51C*, RAD51D*, RB1, RECQL, RET, SDHA, SDHAF2, SDHB, SDHC, SDHD, SMAD4, SMARCA4, SMARCB1, SMARCE1, STK11, SUFU, TMEM127, TP53*, TSC1, TSC2, VHL and XRCC2 (sequencing and deletion/duplication); CASR, CFTR, CPA1, CTRC, EGFR, EGLN1, FAM175A, HOXB13, KIT, MITF, MLH3, PALLD, PDGFRA, POLD1, POLE, PRSS1, RINT1, RPS20, SPINK1 and TERT (sequencing only); EPCAM and GREM1 (deletion/duplication only). DNA and RNA analyses performed for * genes.   Based on Brian Avery's personal and family history of cancer, he meets medical criteria for genetic testing. Despite that he meets criteria, he may still have an out of pocket cost. We discussed that if his out of pocket cost for testing is over $100, the laboratory will call and confirm whether he wants to proceed with testing.  If the out of pocket cost of testing is less than $100 he will be billed by the genetic testing laboratory.   PLAN: After considering the risks, benefits, and limitations, Mr.  Avery provided informed consent to pursue genetic testing and the blood sample was sent to [facility] for analysis of the CustomNext-Cancer+RNAinsight. Results should be available within approximately 2-3 weeks' time, at which point they will be disclosed by telephone to Brian Avery, as will any additional recommendations warranted by these results. Brian Avery will receive a summary of his genetic counseling visit and a copy of his results once available. This information will also be available in Epic.   Lastly, we encouraged Brian Avery to remain in contact with cancer genetics annually so that we can continuously update the family history and inform him of any changes in cancer genetics and testing that may be of benefit for this family.   Brian Avery's questions were answered to his satisfaction today. Our contact information was provided should additional questions or concerns arise. Thank you for the referral and allowing [place] to share in the care of your patient.   Charlisha Market P. Florene Avery, [place], [facility] Licensed, Insurance risk surveyor [email or web address] phone: [phone]  The patient was seen for a total of 45 minutes in face-to-face genetic counseling.  This patient was discussed with Drs. Magrinat, Lindi Adie and/or Burr Medico who agrees with the above.    _______________________________________________________________________ For Office Staff:  Number of people involved in session: 1 Was an Intern/ student involved with case: no

## 2019-07-06 NOTE — Patient Instructions (Signed)
DUE TO COVID-19 ONLY ONE VISITOR IS ALLOWED TO COME WITH YOU AND STAY IN THE WAITING ROOM ONLY DURING PRE OP AND PROCEDURE DAY OF SURGERY. THE 1 VISITOR MAY VISIT WITH YOU AFTER SURGERY IN YOUR PRIVATE ROOM DURING VISITING HOURS ONLY!  YOU NEED TO HAVE A COVID 19 TEST ON: 07/19/19 @ 2:55 pm, THIS TEST MUST BE DONE BEFORE SURGERY, COME  [address], [place] , [id number].  ([place]) ONCE YOUR COVID TEST IS COMPLETED, PLEASE BEGIN THE QUARANTINE INSTRUCTIONS AS OUTLINED IN YOUR HANDOUT.                Juline Patch   Your procedure is scheduled on: 07/23/19   Report to [facility] Main  Entrance   Report to admitting at: 10:00 AM     Call this number if you have problems the morning of surgery [phone]    Remember:   DRINK 2 [place] AT  1000 PM AND 1 PRESURGERY DRINK THE DAY OF THE PROCEDURE 3 HOURS PRIOR TO SCHEDULED SURGERY. NO SOLIDS AFTER MIDNIGHT THE DAY PRIOR TO THE SURGERY. NOTHING BY MOUTH EXCEPT CLEAR LIQUIDS UNTIL THREE HOURS PRIOR TO SCHEDULED SURGERY. PLEASE FINISH PRESURGERY ENSURE DRINK PER SURGEON ORDER 3 HOURS PRIOR TO SCHEDULED SURGERY TIME WHICH NEEDS TO BE COMPLETED AT: 9:00 am. MAKE SURE TO DRINK PLENTY CLEAR LIQUIDS THE DAY OF THE BOWEL PREP.   CLEAR LIQUID DIET   Foods Allowed                                                                     Foods Excluded  Coffee and tea, regular and decaf                             liquids that you cannot  Plain Jell-O any favor except red or purple                                           see through such as: Fruit ices (not with fruit pulp)                                     milk, soups, orange juice  Iced Popsicles                                    All solid food Carbonated beverages, regular and diet                                    Cranberry, grape and apple juices Sports drinks like Gatorade Lightly seasoned clear broth or consume(fat  free) Sugar, honey syrup  Sample Menu Breakfast  Lunch                                     Supper Cranberry juice                    Beef broth                            Chicken broth Jell-O                                     Grape juice                           Apple juice Coffee or tea                        Jell-O                                      Popsicle                                                Coffee or tea                        Coffee or tea  _____________________________________________________________________  BRUSH YOUR TEETH MORNING OF SURGERY AND RINSE YOUR MOUTH OUT, NO CHEWING GUM CANDY OR MINTS.     Take these medicines the morning of surgery with A SIP OF WATER: flagyl,neomycin.                                 You may not have any metal on your body including hair pins and              piercings  Do not wear jewelry,lotions, powders or perfumes, deodorant             Men may shave face and neck.   Do not bring valuables to the hospital. [place].  Contacts, dentures or bridgework may not be worn into surgery.  Leave suitcase in the car. After surgery it may be brought to your room.     Patients discharged the day of surgery will not be allowed to drive home. IF YOU ARE HAVING SURGERY AND GOING HOME THE SAME DAY, YOU MUST HAVE AN ADULT TO DRIVE YOU HOME AND BE WITH YOU FOR 24 HOURS. YOU MAY GO HOME BY TAXI OR UBER OR ORTHERWISE, BUT AN ADULT MUST ACCOMPANY YOU HOME AND STAY WITH YOU FOR 24 HOURS.  Name and phone number of your driver:  Special Instructions: N/A              Please read over the following fact sheets you were given: _____________________________________________________________________  [facility] - Preparing for Surgery Before surgery, you can play an important role.  Because skin is  not sterile, your skin needs to be as free of germs as possible.  You can  reduce the number of germs on your skin by washing with CHG (chlorahexidine gluconate) soap before surgery.  CHG is an antiseptic cleaner which kills germs and bonds with the skin to continue killing germs even after washing. Please DO NOT use if you have an allergy to CHG or antibacterial soaps.  If your skin becomes reddened/irritated stop using the CHG and inform your nurse when you arrive at Short Stay. Do not shave (including legs and underarms) for at least 48 hours prior to the first CHG shower.  You may shave your face/neck. Please follow these instructions carefully:  1.  Shower with CHG Soap the night before surgery and the  morning of Surgery.  2.  If you choose to wash your hair, wash your hair first as usual with your  normal  shampoo.  3.  After you shampoo, rinse your hair and body thoroughly to remove the  shampoo.                           4.  Use CHG as you would any other liquid soap.  You can apply chg directly  to the skin and wash                       Gently with a scrungie or clean washcloth.  5.  Apply the CHG Soap to your body ONLY FROM THE NECK DOWN.   Do not use on face/ open                           Wound or open sores. Avoid contact with eyes, ears mouth and genitals (private parts).                       Wash face,  Genitals (private parts) with your normal soap.             6.  Wash thoroughly, paying special attention to the area where your surgery  will be performed.  7.  Thoroughly rinse your body with warm water from the neck down.  8.  DO NOT shower/wash with your normal soap after using and rinsing off  the CHG Soap.                9.  Pat yourself dry with a clean towel.            10.  Wear clean pajamas.            11.  Place clean sheets on your bed the night of your first shower and do not  sleep with pets. Day of Surgery : Do not apply any lotions/deodorants the morning of surgery.  Please wear clean clothes to the hospital/surgery center.  FAILURE TO  FOLLOW THESE INSTRUCTIONS MAY RESULT IN THE CANCELLATION OF YOUR SURGERY PATIENT SIGNATURE_________________________________  NURSE SIGNATURE__________________________________  ________________________________________________________________________   Adam Phenix  An incentive spirometer is a tool that can help keep your lungs clear and active. This tool measures how well you are filling your lungs with each breath. Taking long deep breaths may help reverse or decrease the chance of developing breathing (pulmonary) problems (especially infection) following:  A long period of time when you are unable to move or be active. BEFORE THE PROCEDURE   If the spirometer  includes an indicator to show your best effort, your nurse or respiratory therapist will set it to a desired goal.  If possible, sit up straight or lean slightly forward. Try not to slouch.  Hold the incentive spirometer in an upright position. INSTRUCTIONS FOR USE  1. Sit on the edge of your bed if possible, or sit up as far as you can in bed or on a chair. 2. Hold the incentive spirometer in an upright position. 3. Breathe out normally. 4. Place the mouthpiece in your mouth and seal your lips tightly around it. 5. Breathe in slowly and as deeply as possible, raising the piston or the ball toward the top of the column. 6. Hold your breath for 3-5 seconds or for as long as possible. Allow the piston or ball to fall to the bottom of the column. 7. Remove the mouthpiece from your mouth and breathe out normally. 8. Rest for a few seconds and repeat Steps 1 through 7 at least 10 times every 1-2 hours when you are awake. Take your time and take a few normal breaths between deep breaths. 9. The spirometer may include an indicator to show your best effort. Use the indicator as a goal to work toward during each repetition. 10. After each set of 10 deep breaths, practice coughing to be sure your lungs are clear. If you have an  incision (the cut made at the time of surgery), support your incision when coughing by placing a pillow or rolled up towels firmly against it. Once you are able to get out of bed, walk around indoors and cough well. You may stop using the incentive spirometer when instructed by your caregiver.  RISKS AND COMPLICATIONS  Take your time so you do not get dizzy or light-headed.  If you are in pain, you may need to take or ask for pain medication before doing incentive spirometry. It is harder to take a deep breath if you are having pain. AFTER USE  Rest and breathe slowly and easily.  It can be helpful to keep track of a log of your progress. Your caregiver can provide you with a simple table to help with this. If you are using the spirometer at home, follow these instructions: [place] IF:   You are having difficultly using the spirometer.  You have trouble using the spirometer as often as instructed.  Your pain medication is not giving enough relief while using the spirometer.  You develop fever of 100.5 F (38.1 C) or higher. SEEK IMMEDIATE MEDICAL CARE IF:   You cough up bloody sputum that had not been present before.  You develop fever of 102 F (38.9 C) or greater.  You develop worsening pain at or near the incision site. MAKE SURE YOU:   Understand these instructions.  Will watch your condition.  Will get help right away if you are not doing well or get worse. Document Released: 06/14/2006 Document Revised: 04/26/2011 Document Reviewed: 08/15/2006 [facility] Patient Information 2014 [place], [place].   ________________________________________________________________________

## 2019-07-06 NOTE — Progress Notes (Addendum)
PCP - Dr. Janie Morning. LOV:06/14/19 Cardiologist -   Chest x-ray - CT chest: 06/05/19. EPIC EKG -  Stress Test -  ECHO -  Cardiac Cath -   Sleep Study -  CPAP -   Fasting Blood Sugar -  Checks Blood Sugar _____ times a day  Blood Thinner Instructions: Aspirin Instructions: Last Dose:  Anesthesia review:   Patient denies shortness of breath, fever, cough and chest pain at PAT appointment   Patient verbalized understanding of instructions that were given to them at the PAT appointment. Patient was also instructed that they will need to review over the PAT instructions again at home before surgery.

## 2019-07-09 ENCOUNTER — Encounter (HOSPITAL_COMMUNITY): Payer: Self-pay

## 2019-07-09 ENCOUNTER — Encounter (HOSPITAL_COMMUNITY)
Admission: RE | Admit: 2019-07-09 | Discharge: 2019-07-09 | Disposition: A | Discharge status: Home / Self Care | Payer: 59 | Source: Ambulatory Visit | Attending: Surgery | Admitting: Surgery

## 2019-07-09 ENCOUNTER — Other Ambulatory Visit: Payer: Self-pay

## 2019-07-09 DIAGNOSIS — Z01818 Encounter for other preprocedural examination: Secondary | ICD-10-CM | POA: Insufficient documentation

## 2019-07-09 HISTORY — DX: Malignant (primary) neoplasm, unspecified: C80.1

## 2019-07-17 ENCOUNTER — Encounter: Payer: Self-pay | Admitting: Genetic Counselor

## 2019-07-17 ENCOUNTER — Ambulatory Visit: Payer: Self-pay | Admitting: Genetic Counselor

## 2019-07-17 ENCOUNTER — Telehealth: Payer: Self-pay | Admitting: Genetic Counselor

## 2019-07-17 DIAGNOSIS — C187 Malignant neoplasm of sigmoid colon: Secondary | ICD-10-CM

## 2019-07-17 DIAGNOSIS — Z1379 Encounter for other screening for genetic and chromosomal anomalies: Secondary | ICD-10-CM

## 2019-07-17 NOTE — Telephone Encounter (Signed)
Revealed negative genetic testing.  Discussed that we do not know why he has colon cancer or why there is cancer in the family. It could be due to a different gene that we are not testing, or maybe our current technology may not be able to pick something up.  It will be important for him to keep in contact with genetics to keep up with whether additional testing may be needed.   BARD1 VUS.  This will not change medical management

## 2019-07-17 NOTE — Progress Notes (Signed)
HPI:  Mr. Stidd was previously seen in the [facility] due to a personal and family history of cancer and concerns regarding a hereditary predisposition to cancer. Please refer to our prior cancer genetics clinic note for more information regarding our discussion, assessment and recommendations, at the time. Mr. Mountjoy's recent genetic test results were disclosed to him, as were recommendations warranted by these results. These results and recommendations are discussed in more detail below.  CANCER HISTORY:  Oncology History  Colon cancer (HCC)  06/27/2019 Initial Diagnosis   Colon cancer (HCC)    Genetic Testing   Negative genetic testing.  BARD1 p.K637E VUS identified.  The CustomNext-Cancer gene panel offered by [facility] and includes sequencing and rearrangement analysis for the following 91 genes: AIP, ALK, APC*, ATM*, AXIN2, BAP1, BARD1, BLM, BMPR1A, BRCA1*, BRCA2*, BRIP1*, CDC73, CDH1*, CDK4, CDKN1B, CDKN2A, CHEK2*, CTNNA1, DICER1, FANCC, FH, FLCN, GALNT12, KIF1B, LZTR1, MAX, MEN1, MET, MLH1*, MRE11A, MSH2*, MSH3, MSH6*, MUTYH*, NBN, NF1*, NF2, NTHL1, PALB2*, PHOX2B, PMS2*, POT1, PRKAR1A, PTCH1, PTEN*, RAD50, RAD51C*, RAD51D*, RB1, RECQL, RET, SDHA, SDHAF2, SDHB, SDHC, SDHD, SMAD4, SMARCA4, SMARCB1, SMARCE1, STK11, SUFU, TMEM127, TP53*, TSC1, TSC2, VHL and XRCC2 (sequencing and deletion/duplication); CASR, CFTR, CPA1, CTRC, EGFR, EGLN1, FAM175A, HOXB13, KIT, MITF, MLH3, PALLD, PDGFRA, POLD1, POLE, PRSS1, RINT1, RPS20, SPINK1 and TERT (sequencing only); EPCAM and GREM1 (deletion/duplication only). DNA and RNA analyses performed for * genes. The report date is Jul 12, 2019.     FAMILY HISTORY:  We obtained a detailed, 4-generation family history.  Significant diagnoses are listed below: Family History  Problem Relation Age of Onset  . Breast cancer Mother 80       second dx at 83  . Factor V Leiden deficiency Mother   . Hypertension Father   . Colon cancer  Maternal Grandfather 60       d. 62  . Ovarian cancer Sister 59  . Prostate cancer Maternal Uncle 77       d. 80  . Dementia Paternal Uncle   . Heart disease Maternal Grandmother   . Lung cancer Paternal Grandfather   . Colon cancer Maternal Uncle 82  . Factor V Leiden deficiency Maternal Uncle   . Thrombosis Maternal Uncle        due to FVL  . Esophageal cancer Neg Hx   . Stomach cancer Neg Hx   . Rectal cancer Neg Hx     The patient has two sons who are cancer free.  He has three sisters and a brother.  His brother has Factor V Leiden and a sister has a diagnosis of ovarian cancer. His sister had genetic testing, but the results are not known. The parents are both living.  The patient's mother had bilateral breast cancer at age 80 and 83.  She has two sisters and two brothers.  One brother died of prostate cancer and another brother has colon cancer.  The maternal grandparents are deceased. The grandfather had colon cancer around age 60.  The patients' father was one of 12 children.  He has a few siblings who died of dementia, but no reported cancer.  His parents are deceased, his mother died at 102, and his father died of lung cancer.  Mr. Munos is aware of previous family history of genetic testing for hereditary cancer risks. Patient's maternal ancestors are of [place] descent, and paternal ancestors are of [place] descent. There is no reported Ashkenazi Jewish ancestry. There is no known consanguinity.      GENETIC TEST RESULTS: Genetic testing reported out on Jul 12, 2019 through the CustomNext-Cancer panel found no pathogenic mutations. The CustomNext-Cancer gene panel offered by [facility] and includes sequencing and rearrangement analysis for the following 91 genes: AIP, ALK, APC*, ATM*, AXIN2, BAP1, BARD1, BLM, BMPR1A, BRCA1*, BRCA2*, BRIP1*, CDC73, CDH1*, CDK4, CDKN1B, CDKN2A, CHEK2*, CTNNA1, DICER1, FANCC, FH, FLCN, GALNT12, KIF1B, LZTR1, MAX, MEN1,  MET, MLH1*, MRE11A, MSH2*, MSH3, MSH6*, MUTYH*, NBN, NF1*, NF2, NTHL1, PALB2*, PHOX2B, PMS2*, POT1, PRKAR1A, PTCH1, PTEN*, RAD50, RAD51C*, RAD51D*, RB1, RECQL, RET, SDHA, SDHAF2, SDHB, SDHC, SDHD, SMAD4, SMARCA4, SMARCB1, SMARCE1, STK11, SUFU, TMEM127, TP53*, TSC1, TSC2, VHL and XRCC2 (sequencing and deletion/duplication); CASR, CFTR, CPA1, CTRC, EGFR, EGLN1, FAM175A, HOXB13, KIT, MITF, MLH3, PALLD, PDGFRA, POLD1, POLE, PRSS1, RINT1, RPS20, SPINK1 and TERT (sequencing only); EPCAM and GREM1 (deletion/duplication only). DNA and RNA analyses performed for * genes. The test report has been scanned into EPIC and is located under the Molecular Pathology section of the Results Review tab.  A portion of the result report is included below for reference.     We discussed with Mr. Shere that because current genetic testing is not perfect, it is possible there may be a gene mutation in one of these genes that current testing cannot detect, but that chance is small.  We also discussed, that there could be another gene that has not yet been discovered, or that we have not yet tested, that is responsible for the cancer diagnoses in the family. It is also possible there is a hereditary cause for the cancer in the family that Mr. Millon did not inherit and therefore was not identified in his testing.  Therefore, it is important to remain in touch with cancer genetics in the future so that we can continue to offer Mr. Pare the most up to date genetic testing.   Genetic testing did identify a variant of uncertain significance (VUS) was identified in the BARD1 gene called p.K637E.  At this time, it is unknown if this variant is associated with increased cancer risk or if this is a normal finding, but most variants such as this get reclassified to being inconsequential. It should not be used to make medical management decisions. With time, we suspect the lab will determine the significance of this variant, if any. If we do  learn more about it, we will try to contact Mr. Nevels to discuss it further. However, it is important to stay in touch with us periodically and keep the address and phone number up to date.  ADDITIONAL GENETIC TESTING: We discussed with Mr. Chillemi that there are other genes that are associated with increased cancer risk that can be analyzed. Should Mr. Kestler wish to pursue additional genetic testing, we are happy to discuss and coordinate this testing, at any time.    CANCER SCREENING RECOMMENDATIONS: Mr. Sparks's test result is considered negative (normal).  This means that we have not identified a hereditary cause for his personal and family history of cancer at this time. Most cancers happen by chance and this negative test suggests that his cancer may fall into this category.    While reassuring, this does not definitively rule out a hereditary predisposition to cancer. It is still possible that there could be genetic mutations that are undetectable by current technology. There could be genetic mutations in genes that have not been tested or identified to increase cancer risk.  Therefore, it is recommended he continue to follow the cancer management and screening guidelines provided   by his oncology and primary healthcare provider.   An individual's cancer risk and medical management are not determined by genetic test results alone. Overall cancer risk assessment incorporates additional factors, including personal medical history, family history, and any available genetic information that may result in a personalized plan for cancer prevention and surveillance  RECOMMENDATIONS FOR FAMILY MEMBERS:  Individuals in this family might be at some increased risk of developing cancer, over the general population risk, simply due to the family history of cancer.  We recommended women in this family have a yearly mammogram beginning at age 55, or 74 years younger than the earliest onset of cancer, an annual  clinical breast exam, and perform monthly breast self-exams. Women in this family should also have a gynecological exam as recommended by their primary provider. All family members should have a colonoscopy by age 41.  It is also possible there is a hereditary cause for the cancer in Mr. Beretta's family that he did not inherit and therefore was not identified in him.  Based on Mr. Terrance's family history, we recommended his sister, who was diagnosed with ovarian cancer at age 3, have genetic counseling and testing. Mr. Conchas will let us know if we can be of any assistance in coordinating genetic counseling and/or testing for this family member.   FOLLOW-UP: Lastly, we discussed with Mr. Martinezlopez that cancer genetics is a rapidly advancing field and it is possible that new genetic tests will be appropriate for him and/or his family members in the future. We encouraged him to remain in contact with cancer genetics on an annual basis so we can update his personal and family histories and let him know of advances in cancer genetics that may benefit this family.   Our contact number was provided. Mr. Deininger's questions were answered to his satisfaction, and he knows he is welcome to call us at anytime with additional questions or concerns.   Roma Kayser, [place], [facility] Licensed, Certified Genetic Counselor [email or web address]

## 2019-07-18 ENCOUNTER — Other Ambulatory Visit: Payer: Self-pay

## 2019-07-18 ENCOUNTER — Encounter (HOSPITAL_COMMUNITY)
Admission: RE | Admit: 2019-07-18 | Discharge: 2019-07-18 | Disposition: A | Discharge status: Home / Self Care | Payer: 59 | Source: Ambulatory Visit | Attending: Surgery | Admitting: Surgery

## 2019-07-18 DIAGNOSIS — Z01812 Encounter for preprocedural laboratory examination: Secondary | ICD-10-CM | POA: Diagnosis not present

## 2019-07-18 LAB — ABO/RH: ABO/RH(D): A POS

## 2019-07-18 LAB — CBC WITH DIFFERENTIAL/PLATELET
Abs Immature Granulocytes: 0.02 10*3/uL (ref 0.00–0.07)
Basophils Absolute: 0.1 10*3/uL (ref 0.0–0.1)
Basophils Relative: 1 %
Eosinophils Absolute: 0.4 10*3/uL (ref 0.0–0.5)
Eosinophils Relative: 6 %
HCT: 46.2 % (ref 39.0–52.0)
Hemoglobin: 15.2 g/dL (ref 13.0–17.0)
Immature Granulocytes: 0 %
Lymphocytes Relative: 32 %
Lymphs Abs: 2.1 10*3/uL (ref 0.7–4.0)
MCH: 32.5 pg (ref 26.0–34.0)
MCHC: 32.9 g/dL (ref 30.0–36.0)
MCV: 98.7 fL (ref 80.0–100.0)
Monocytes Absolute: 0.6 10*3/uL (ref 0.1–1.0)
Monocytes Relative: 10 %
Neutro Abs: 3.4 10*3/uL (ref 1.7–7.7)
Neutrophils Relative %: 51 %
Platelets: 255 10*3/uL (ref 150–400)
RBC: 4.68 MIL/uL (ref 4.22–5.81)
RDW: 13.7 % (ref 11.5–15.5)
WBC: 6.6 10*3/uL (ref 4.0–10.5)
nRBC: 0 % (ref 0.0–0.2)

## 2019-07-18 LAB — COMPREHENSIVE METABOLIC PANEL
ALT: 43 U/L (ref 0–44)
AST: 47 U/L — ABNORMAL HIGH (ref 15–41)
Albumin: 4.4 g/dL (ref 3.5–5.0)
Alkaline Phosphatase: 43 U/L (ref 38–126)
Anion gap: 11 (ref 5–15)
BUN: 17 mg/dL (ref 6–20)
CO2: 28 mmol/L (ref 22–32)
Calcium: 9.1 mg/dL (ref 8.9–10.3)
Chloride: 102 mmol/L (ref 98–111)
Creatinine, Ser: 0.9 mg/dL (ref 0.61–1.24)
GFR calc Af Amer: >60 mL/min (ref >60)
GFR calc non Af Amer: >60 mL/min (ref >60)
Glucose, Bld: 99 mg/dL (ref 70–99)
Potassium: 4.4 mmol/L (ref 3.5–5.1)
Sodium: 141 mmol/L (ref 135–145)
Total Bilirubin: 0.5 mg/dL (ref 0.3–1.2)
Total Protein: 7 g/dL (ref 6.5–8.1)

## 2019-07-18 LAB — HEMOGLOBIN A1C
Hgb A1c MFr Bld: 5.3 % (ref 4.8–5.6)
Mean Plasma Glucose: 105.41 mg/dL

## 2019-07-18 LAB — PROTIME-INR
INR: 0.9 (ref 0.8–1.2)
Prothrombin Time: 12.2 seconds (ref 11.4–15.2)

## 2019-07-18 LAB — APTT: aPTT: 25 seconds (ref 24–36)

## 2019-07-19 ENCOUNTER — Other Ambulatory Visit (HOSPITAL_COMMUNITY)
Admission: RE | Admit: 2019-07-19 | Discharge: 2019-07-19 | Disposition: A | Discharge status: Home / Self Care | Payer: 59 | Source: Ambulatory Visit | Attending: Surgery | Admitting: Surgery

## 2019-07-19 DIAGNOSIS — Z20822 Contact with and (suspected) exposure to covid-19: Secondary | ICD-10-CM | POA: Insufficient documentation

## 2019-07-19 DIAGNOSIS — Z01812 Encounter for preprocedural laboratory examination: Secondary | ICD-10-CM | POA: Diagnosis not present

## 2019-07-19 LAB — SARS CORONAVIRUS 2 (TAT 6-24 HRS): SARS Coronavirus 2: NEGATIVE

## 2019-07-20 ENCOUNTER — Encounter: Payer: Self-pay | Admitting: Oncology

## 2019-07-21 NOTE — Anesthesia Preprocedure Evaluation (Addendum)
Anesthesia Evaluation  Patient identified by MRN, date of birth, ID band Patient awake    Reviewed: Allergy & Precautions, NPO status , Patient's Chart, lab work & pertinent test results  History of Anesthesia Complications Negative for: history of anesthetic complications  Airway Mallampati: II  TM Distance: >3 FB Neck ROM: Full    Dental no notable dental hx.    Pulmonary  Sarcoidosis   Pulmonary exam normal        Cardiovascular negative cardio ROS Normal cardiovascular exam     Neuro/Psych negative neurological ROS  negative psych ROS   GI/Hepatic Neg liver ROS, Colon ca   Endo/Other  negative endocrine ROS  Renal/GU negative Renal ROS  negative genitourinary   Musculoskeletal  (+) Arthritis ,   Abdominal   Peds  Hematology negative hematology ROS (+)   Anesthesia Other Findings Day of surgery medications reviewed with patient.  Reproductive/Obstetrics negative OB ROS                            Anesthesia Physical Anesthesia Plan  ASA: II  Anesthesia Plan: General   Post-op Pain Management:    Induction: Intravenous  PONV Risk Score and Plan: 4 or greater and Treatment may vary due to age or medical condition, Ondansetron, Dexamethasone and Midazolam  Airway Management Planned: Oral ETT  Additional Equipment: None  Intra-op Plan:   Post-operative Plan: Extubation in OR  Informed Consent: I have reviewed the patients History and Physical, chart, labs and discussed the procedure including the risks, benefits and alternatives for the proposed anesthesia with the patient or authorized representative who has indicated his/her understanding and acceptance.     Dental advisory given  Plan Discussed with: CRNA  Anesthesia Plan Comments:        Anesthesia Quick Evaluation

## 2019-07-22 MED ORDER — BUPIVACAINE LIPOSOME 1.3 % IJ SUSP
20.0000 mL | Freq: Once | INTRAMUSCULAR | Status: DC
  Filled 2019-07-22: qty 20

## 2019-07-23 ENCOUNTER — Other Ambulatory Visit: Payer: Self-pay

## 2019-07-23 ENCOUNTER — Inpatient Hospital Stay (HOSPITAL_COMMUNITY): Payer: 59 | Admitting: Anesthesiology

## 2019-07-23 ENCOUNTER — Inpatient Hospital Stay (HOSPITAL_COMMUNITY)
Admission: RE | Admit: 2019-07-23 | Discharge: 2019-07-25 | DRG: 331 | Disposition: A | Discharge status: Home / Self Care | Payer: 59 | Attending: Surgery | Admitting: Surgery

## 2019-07-23 ENCOUNTER — Encounter (HOSPITAL_COMMUNITY): Payer: Self-pay | Admitting: Surgery

## 2019-07-23 ENCOUNTER — Encounter (HOSPITAL_COMMUNITY)
Admission: RE | Disposition: A | Discharge status: Home / Self Care | Payer: Self-pay | Source: Home / Self Care | Attending: Surgery

## 2019-07-23 DIAGNOSIS — Z8041 Family history of malignant neoplasm of ovary: Secondary | ICD-10-CM

## 2019-07-23 DIAGNOSIS — Z9049 Acquired absence of other specified parts of digestive tract: Secondary | ICD-10-CM

## 2019-07-23 DIAGNOSIS — Z800 Family history of malignant neoplasm of digestive organs: Secondary | ICD-10-CM

## 2019-07-23 DIAGNOSIS — C19 Malignant neoplasm of rectosigmoid junction: Secondary | ICD-10-CM | POA: Diagnosis not present

## 2019-07-23 DIAGNOSIS — M1990 Unspecified osteoarthritis, unspecified site: Secondary | ICD-10-CM | POA: Diagnosis present

## 2019-07-23 DIAGNOSIS — C189 Malignant neoplasm of colon, unspecified: Secondary | ICD-10-CM | POA: Diagnosis not present

## 2019-07-23 DIAGNOSIS — Z8249 Family history of ischemic heart disease and other diseases of the circulatory system: Secondary | ICD-10-CM | POA: Diagnosis not present

## 2019-07-23 DIAGNOSIS — Z803 Family history of malignant neoplasm of breast: Secondary | ICD-10-CM

## 2019-07-23 DIAGNOSIS — Z87891 Personal history of nicotine dependence: Secondary | ICD-10-CM | POA: Diagnosis not present

## 2019-07-23 DIAGNOSIS — K660 Peritoneal adhesions (postprocedural) (postinfection): Secondary | ICD-10-CM | POA: Diagnosis present

## 2019-07-23 DIAGNOSIS — Z801 Family history of malignant neoplasm of trachea, bronchus and lung: Secondary | ICD-10-CM

## 2019-07-23 DIAGNOSIS — C187 Malignant neoplasm of sigmoid colon: Secondary | ICD-10-CM | POA: Diagnosis not present

## 2019-07-23 DIAGNOSIS — Z8042 Family history of malignant neoplasm of prostate: Secondary | ICD-10-CM | POA: Diagnosis not present

## 2019-07-23 HISTORY — PX: FLEXIBLE SIGMOIDOSCOPY: SHX5431

## 2019-07-23 LAB — TYPE AND SCREEN
ABO/RH(D): A POS
Antibody Screen: NEGATIVE

## 2019-07-23 SURGERY — COLECTOMY, PARTIAL, ROBOT-ASSISTED, LAPAROSCOPIC
Anesthesia: General

## 2019-07-23 MED ORDER — TRAMADOL HCL 50 MG PO TABS
50.0000 mg | ORAL_TABLET | Freq: Four times a day (QID) | ORAL | Status: DC | PRN
  Administered 2019-07-23 – 2019-07-24 (×4): 50 mg via ORAL
  Filled 2019-07-23 (×4): qty 1

## 2019-07-23 MED ORDER — ORAL CARE MOUTH RINSE: 15.0000 mL | Freq: Once | OROMUCOSAL | Status: AC

## 2019-07-23 MED ORDER — FENTANYL CITRATE (PF) 100 MCG/2ML IJ SOLN
INTRAMUSCULAR | Status: DC | PRN
  Administered 2019-07-23: 50 ug via INTRAVENOUS
  Administered 2019-07-23: 100 ug via INTRAVENOUS
  Administered 2019-07-23 (×2): 50 ug via INTRAVENOUS

## 2019-07-23 MED ORDER — ALUM & MAG HYDROXIDE-SIMETH 200-200-20 MG/5ML PO SUSP: 30.0000 mL | Freq: Four times a day (QID) | ORAL | Status: DC | PRN

## 2019-07-23 MED ORDER — ROCURONIUM BROMIDE 10 MG/ML (PF) SYRINGE
PREFILLED_SYRINGE | INTRAVENOUS | Status: DC | PRN
  Administered 2019-07-23: 60 mg via INTRAVENOUS
  Administered 2019-07-23: 20 mg via INTRAVENOUS
  Administered 2019-07-23: 30 mg via INTRAVENOUS
  Administered 2019-07-23: 40 mg via INTRAVENOUS

## 2019-07-23 MED ORDER — BUPIVACAINE LIPOSOME 1.3 % IJ SUSP
INTRAMUSCULAR | Status: DC | PRN
  Administered 2019-07-23: 20 mL

## 2019-07-23 MED ORDER — LACTATED RINGERS IV SOLN: INTRAVENOUS | Status: DC

## 2019-07-23 MED ORDER — ONDANSETRON HCL 4 MG/2ML IJ SOLN: 4.0000 mg | Freq: Four times a day (QID) | INTRAMUSCULAR | Status: DC | PRN

## 2019-07-23 MED ORDER — PROPOFOL 10 MG/ML IV BOLUS
INTRAVENOUS | Status: AC
  Filled 2019-07-23: qty 20

## 2019-07-23 MED ORDER — BUPIVACAINE HCL (PF) 0.25 % IJ SOLN
INTRAMUSCULAR | Status: DC | PRN
  Administered 2019-07-23: 30 mL

## 2019-07-23 MED ORDER — PROPOFOL 10 MG/ML IV BOLUS
INTRAVENOUS | Status: DC | PRN
  Administered 2019-07-23: 20 mg via INTRAVENOUS
  Administered 2019-07-23: 180 mg via INTRAVENOUS

## 2019-07-23 MED ORDER — SODIUM CHLORIDE 0.9 % IV SOLN
2.0000 g | INTRAVENOUS | Status: AC
  Administered 2019-07-23: 2 g via INTRAVENOUS
  Filled 2019-07-23: qty 2

## 2019-07-23 MED ORDER — FENTANYL CITRATE (PF) 100 MCG/2ML IJ SOLN
INTRAMUSCULAR | Status: AC
  Filled 2019-07-23: qty 2

## 2019-07-23 MED ORDER — OXYCODONE HCL 5 MG PO TABS
ORAL_TABLET | ORAL | Status: AC
  Filled 2019-07-23: qty 1

## 2019-07-23 MED ORDER — LIDOCAINE 2% (20 MG/ML) 5 ML SYRINGE
INTRAMUSCULAR | Status: AC
  Filled 2019-07-23: qty 5

## 2019-07-23 MED ORDER — CHLORHEXIDINE GLUCONATE CLOTH 2 % EX PADS: 6.0000 | MEDICATED_PAD | Freq: Once | CUTANEOUS | Status: DC

## 2019-07-23 MED ORDER — OXYCODONE HCL 5 MG PO TABS
5.0000 mg | ORAL_TABLET | Freq: Once | ORAL | Status: AC | PRN
  Administered 2019-07-23: 5 mg via ORAL

## 2019-07-23 MED ORDER — BUPIVACAINE-EPINEPHRINE 0.5% -1:200000 IJ SOLN
INTRAMUSCULAR | Status: AC
  Filled 2019-07-23: qty 1

## 2019-07-23 MED ORDER — DEXAMETHASONE SODIUM PHOSPHATE 4 MG/ML IJ SOLN
INTRAMUSCULAR | Status: DC | PRN
  Administered 2019-07-23: 8 mg via INTRAVENOUS

## 2019-07-23 MED ORDER — BISACODYL 5 MG PO TBEC: 20.0000 mg | DELAYED_RELEASE_TABLET | Freq: Once | ORAL | Status: DC

## 2019-07-23 MED ORDER — ACETAMINOPHEN 500 MG PO TABS
1000.0000 mg | ORAL_TABLET | Freq: Four times a day (QID) | ORAL | Status: DC
  Administered 2019-07-23 – 2019-07-25 (×6): 1000 mg via ORAL
  Filled 2019-07-23 (×6): qty 2

## 2019-07-23 MED ORDER — FENTANYL CITRATE (PF) 100 MCG/2ML IJ SOLN
25.0000 ug | INTRAMUSCULAR | Status: DC | PRN
  Administered 2019-07-23: 50 ug via INTRAVENOUS

## 2019-07-23 MED ORDER — MIDAZOLAM HCL 2 MG/2ML IJ SOLN
INTRAMUSCULAR | Status: AC
  Filled 2019-07-23: qty 2

## 2019-07-23 MED ORDER — MIDAZOLAM HCL 5 MG/5ML IJ SOLN
INTRAMUSCULAR | Status: DC | PRN
  Administered 2019-07-23: 2 mg via INTRAVENOUS

## 2019-07-23 MED ORDER — NEOMYCIN SULFATE 500 MG PO TABS: 1000.0000 mg | ORAL_TABLET | ORAL | Status: DC

## 2019-07-23 MED ORDER — HEPARIN SODIUM (PORCINE) 5000 UNIT/ML IJ SOLN
5000.0000 [IU] | Freq: Three times a day (TID) | INTRAMUSCULAR | Status: DC
  Administered 2019-07-23 – 2019-07-25 (×5): 5000 [IU] via SUBCUTANEOUS
  Filled 2019-07-23 (×5): qty 1

## 2019-07-23 MED ORDER — ENSURE SURGERY PO LIQD
237.0000 mL | Freq: Two times a day (BID) | ORAL | Status: DC
  Administered 2019-07-24 (×2): 237 mL via ORAL
  Filled 2019-07-23 (×4): qty 237

## 2019-07-23 MED ORDER — LACTATED RINGERS IR SOLN
Status: DC | PRN
  Administered 2019-07-23: 1000 mL

## 2019-07-23 MED ORDER — ONDANSETRON HCL 4 MG/2ML IJ SOLN
INTRAMUSCULAR | Status: AC
  Filled 2019-07-23: qty 2

## 2019-07-23 MED ORDER — SUGAMMADEX SODIUM 200 MG/2ML IV SOLN
INTRAVENOUS | Status: DC | PRN
  Administered 2019-07-23: 189.8 mg via INTRAVENOUS
  Administered 2019-07-23: 200 mg via INTRAVENOUS

## 2019-07-23 MED ORDER — LIDOCAINE 2% (20 MG/ML) 5 ML SYRINGE
INTRAMUSCULAR | Status: DC | PRN
  Administered 2019-07-23: 100 mg via INTRAVENOUS
  Administered 2019-07-23: 1.5 mg/kg/h via INTRAVENOUS

## 2019-07-23 MED ORDER — SODIUM CHLORIDE 0.9 % IR SOLN
Status: DC | PRN
  Administered 2019-07-23: 2000 mL

## 2019-07-23 MED ORDER — HYDROMORPHONE HCL 1 MG/ML IJ SOLN: 0.5000 mg | INTRAMUSCULAR | Status: DC | PRN

## 2019-07-23 MED ORDER — FENTANYL CITRATE (PF) 250 MCG/5ML IJ SOLN
INTRAMUSCULAR | Status: AC
  Filled 2019-07-23: qty 5

## 2019-07-23 MED ORDER — DIPHENHYDRAMINE HCL 12.5 MG/5ML PO ELIX: 12.5000 mg | ORAL_SOLUTION | Freq: Four times a day (QID) | ORAL | Status: DC | PRN

## 2019-07-23 MED ORDER — ROCURONIUM BROMIDE 10 MG/ML (PF) SYRINGE
PREFILLED_SYRINGE | INTRAVENOUS | Status: AC
  Filled 2019-07-23: qty 10

## 2019-07-23 MED ORDER — PROMETHAZINE HCL 25 MG/ML IJ SOLN: 6.2500 mg | INTRAMUSCULAR | Status: DC | PRN

## 2019-07-23 MED ORDER — DIPHENHYDRAMINE HCL 50 MG/ML IJ SOLN: 12.5000 mg | Freq: Four times a day (QID) | INTRAMUSCULAR | Status: DC | PRN

## 2019-07-23 MED ORDER — IBUPROFEN 400 MG PO TABS: 600.0000 mg | ORAL_TABLET | Freq: Four times a day (QID) | ORAL | Status: DC | PRN

## 2019-07-23 MED ORDER — HEPARIN SODIUM (PORCINE) 5000 UNIT/ML IJ SOLN
5000.0000 [IU] | Freq: Once | INTRAMUSCULAR | Status: AC
  Administered 2019-07-23: 5000 [IU] via SUBCUTANEOUS
  Filled 2019-07-23: qty 1

## 2019-07-23 MED ORDER — OXYCODONE HCL 5 MG/5ML PO SOLN: 5.0000 mg | Freq: Once | ORAL | Status: AC | PRN

## 2019-07-23 MED ORDER — ACETAMINOPHEN 500 MG PO TABS
1000.0000 mg | ORAL_TABLET | ORAL | Status: AC
  Administered 2019-07-23: 1000 mg via ORAL
  Filled 2019-07-23: qty 2

## 2019-07-23 MED ORDER — ACETAMINOPHEN 500 MG PO TABS: 1000.0000 mg | ORAL_TABLET | Freq: Once | ORAL | Status: DC

## 2019-07-23 MED ORDER — DEXAMETHASONE SODIUM PHOSPHATE 10 MG/ML IJ SOLN
INTRAMUSCULAR | Status: AC
  Filled 2019-07-23: qty 1

## 2019-07-23 MED ORDER — LIDOCAINE HCL 2 % IJ SOLN
INTRAMUSCULAR | Status: AC
  Filled 2019-07-23: qty 20

## 2019-07-23 MED ORDER — ONDANSETRON HCL 4 MG PO TABS: 4.0000 mg | ORAL_TABLET | Freq: Four times a day (QID) | ORAL | Status: DC | PRN

## 2019-07-23 MED ORDER — BUPIVACAINE HCL 0.25 % IJ SOLN
INTRAMUSCULAR | Status: AC
  Filled 2019-07-23: qty 1

## 2019-07-23 MED ORDER — ALVIMOPAN 12 MG PO CAPS: 12.0000 mg | ORAL_CAPSULE | Freq: Two times a day (BID) | ORAL | Status: DC

## 2019-07-23 MED ORDER — ALVIMOPAN 12 MG PO CAPS
12.0000 mg | ORAL_CAPSULE | ORAL | Status: AC
  Administered 2019-07-23: 12 mg via ORAL
  Filled 2019-07-23: qty 1

## 2019-07-23 MED ORDER — ONDANSETRON HCL 4 MG/2ML IJ SOLN
INTRAMUSCULAR | Status: DC | PRN
  Administered 2019-07-23: 4 mg via INTRAVENOUS

## 2019-07-23 MED ORDER — SODIUM CHLORIDE (PF) 0.9 % IJ SOLN
INTRAMUSCULAR | Status: AC
  Filled 2019-07-23: qty 50

## 2019-07-23 MED ORDER — POLYETHYLENE GLYCOL 3350 17 GM/SCOOP PO POWD: 1.0000 | Freq: Once | ORAL | Status: DC

## 2019-07-23 MED ORDER — KETAMINE HCL 10 MG/ML IJ SOLN
INTRAMUSCULAR | Status: DC | PRN
  Administered 2019-07-23: 40 mg via INTRAVENOUS

## 2019-07-23 MED ORDER — METRONIDAZOLE 500 MG PO TABS: 1000.0000 mg | ORAL_TABLET | ORAL | Status: DC

## 2019-07-23 MED ORDER — INDOCYANINE GREEN 25 MG IV SOLR
INTRAVENOUS | Status: DC | PRN
Start: 2019-07-23 — End: 2019-07-23
  Administered 2019-07-23: 7.5 mg via INTRAVENOUS

## 2019-07-23 MED ORDER — SIMETHICONE 80 MG PO CHEW: 40.0000 mg | CHEWABLE_TABLET | Freq: Four times a day (QID) | ORAL | Status: DC | PRN

## 2019-07-23 MED ORDER — CHLORHEXIDINE GLUCONATE 0.12 % MT SOLN
15.0000 mL | Freq: Once | OROMUCOSAL | Status: AC
  Administered 2019-07-23: 15 mL via OROMUCOSAL

## 2019-07-23 SURGICAL SUPPLY — 109 items
ADH SKN CLS APL DERMABOND .7 (GAUZE/BANDAGES/DRESSINGS) ×2
APL PRP STRL LF DISP 70% ISPRP (MISCELLANEOUS) ×2
APPLIER CLIP 5 13 M/L LIGAMAX5 (MISCELLANEOUS)
APPLIER CLIP ROT 10 11.4 M/L (STAPLE)
APR CLP MED LRG 11.4X10 (STAPLE)
APR CLP MED LRG 5 ANG JAW (MISCELLANEOUS)
BLADE CLIPPER SURG (BLADE) ×2 IMPLANT
CANNULA REDUC XI 12-8 STAPL (CANNULA) ×3
CANNULA REDUC XI 12-8MM STAPL (CANNULA) ×1
CANNULA REDUCER 12-8 DVNC XI (CANNULA) ×2 IMPLANT
CELLS DAT CNTRL 66122 CELL SVR (MISCELLANEOUS) IMPLANT
CHLORAPREP W/TINT 26 (MISCELLANEOUS) ×4 IMPLANT
CLIP APPLIE 5 13 M/L LIGAMAX5 (MISCELLANEOUS) IMPLANT
CLIP APPLIE ROT 10 11.4 M/L (STAPLE) IMPLANT
CLIP VESOLOCK LG 6/CT PURPLE (CLIP) IMPLANT
CLIP VESOLOCK MED LG 6/CT (CLIP) IMPLANT
COVER SURGICAL LIGHT HANDLE (MISCELLANEOUS) ×8 IMPLANT
COVER TIP SHEARS 8 DVNC (MISCELLANEOUS) ×2 IMPLANT
COVER TIP SHEARS 8MM DA VINCI (MISCELLANEOUS) ×4
COVER WAND RF STERILE (DRAPES) IMPLANT
DECANTER SPIKE VIAL GLASS SM (MISCELLANEOUS) ×2 IMPLANT
DERMABOND ADVANCED (GAUZE/BANDAGES/DRESSINGS) ×2
DERMABOND ADVANCED .7 DNX12 (GAUZE/BANDAGES/DRESSINGS) IMPLANT
DEVICE TROCAR PUNCTURE CLOSURE (ENDOMECHANICALS) IMPLANT
DISSECTOR BLUNT TIP ENDO 5MM (MISCELLANEOUS) IMPLANT
DRAIN CHANNEL 19F RND (DRAIN) ×2 IMPLANT
DRAPE ARM DVNC X/XI (DISPOSABLE) ×8 IMPLANT
DRAPE COLUMN DVNC XI (DISPOSABLE) ×2 IMPLANT
DRAPE DA VINCI XI ARM (DISPOSABLE) ×16
DRAPE DA VINCI XI COLUMN (DISPOSABLE) ×4
DRAPE SURG IRRIG POUCH 19X23 (DRAPES) ×4 IMPLANT
DRSG OPSITE POSTOP 4X10 (GAUZE/BANDAGES/DRESSINGS) IMPLANT
DRSG OPSITE POSTOP 4X6 (GAUZE/BANDAGES/DRESSINGS) ×2 IMPLANT
DRSG OPSITE POSTOP 4X8 (GAUZE/BANDAGES/DRESSINGS) IMPLANT
ELECT REM PT RETURN 15FT ADLT (MISCELLANEOUS) ×4 IMPLANT
ENDOLOOP SUT PDS II  0 18 (SUTURE)
ENDOLOOP SUT PDS II 0 18 (SUTURE) IMPLANT
EVACUATOR SILICONE 100CC (DRAIN) ×2 IMPLANT
GLOVE BIO SURGEON STRL SZ7.5 (GLOVE) ×12 IMPLANT
GLOVE ECLIPSE 8.0 STRL XLNG CF (GLOVE) ×8 IMPLANT
GOWN STRL REUS W/TWL XL LVL3 (GOWN DISPOSABLE) ×20 IMPLANT
GRASPER SUT TROCAR 14GX15 (MISCELLANEOUS) IMPLANT
HOLDER FOLEY CATH W/STRAP (MISCELLANEOUS) ×4 IMPLANT
KIT PROCEDURE DA VINCI SI (MISCELLANEOUS) ×4
KIT PROCEDURE DVNC SI (MISCELLANEOUS) IMPLANT
KIT TURNOVER KIT A (KITS) IMPLANT
NDL INSUFFLATION 14GA 120MM (NEEDLE) ×2 IMPLANT
NEEDLE INSUFFLATION 14GA 120MM (NEEDLE) ×4 IMPLANT
NS IRRIG 1000ML POUR BTL (IV SOLUTION) ×4 IMPLANT
PACK CARDIOVASCULAR III (CUSTOM PROCEDURE TRAY) ×4 IMPLANT
PAD POSITIONING PINK XL (MISCELLANEOUS) ×4 IMPLANT
PENCIL SMOKE EVACUATOR (MISCELLANEOUS) IMPLANT
PORT LAP GEL ALEXIS MED 5-9CM (MISCELLANEOUS) ×4 IMPLANT
PROTECTOR NERVE ULNAR (MISCELLANEOUS) ×8 IMPLANT
RELOAD STAPLE 45 BLU REG DVNC (STAPLE) IMPLANT
RELOAD STAPLE 45 GRN THCK DVNC (STAPLE) IMPLANT
RELOAD STAPLE 60 4.3 GRN DVNC (STAPLE) IMPLANT
RELOAD STAPLER 4.3X60 GRN DVNC (STAPLE) ×4 IMPLANT
RETRACTOR WND ALEXIS 18 MED (MISCELLANEOUS) IMPLANT
RTRCTR WOUND ALEXIS 18CM MED (MISCELLANEOUS)
SCISSORS LAP 5X35 DISP (ENDOMECHANICALS) ×4 IMPLANT
SEAL CANN UNIV 5-8 DVNC XI (MISCELLANEOUS) ×8 IMPLANT
SEAL XI 5MM-8MM UNIVERSAL (MISCELLANEOUS) ×16
SEALER VESSEL DA VINCI XI (MISCELLANEOUS) ×4
SEALER VESSEL EXT DVNC XI (MISCELLANEOUS) ×2 IMPLANT
SET IRRIG TUBING LAPAROSCOPIC (IRRIGATION / IRRIGATOR) ×4 IMPLANT
SOLUTION ELECTROLUBE (MISCELLANEOUS) ×4 IMPLANT
STAPLER 45 BLU RELOAD XI (STAPLE) IMPLANT
STAPLER 45 BLUE RELOAD XI (STAPLE)
STAPLER 45 GREEN RELOAD XI (STAPLE)
STAPLER 45 GRN RELOAD XI (STAPLE) IMPLANT
STAPLER 60 DA VINCI SURE FORM (STAPLE) ×4
STAPLER 60 SUREFORM DVNC (STAPLE) IMPLANT
STAPLER CANNULA SEAL DVNC XI (STAPLE) ×2 IMPLANT
STAPLER CANNULA SEAL XI (STAPLE) ×4
STAPLER ECHELON POWER CIR 31 (STAPLE) ×2 IMPLANT
STAPLER RELOAD 4.3X60 GREEN (STAPLE) ×8
STAPLER RELOAD 4.3X60 GRN DVNC (STAPLE) ×4
STAPLER SHEATH (SHEATH) ×4
STAPLER SHEATH ENDOWRIST DVNC (SHEATH) ×2 IMPLANT
STOPCOCK 4 WAY LG BORE MALE ST (IV SETS) ×8 IMPLANT
SURGILUBE 2OZ TUBE FLIPTOP (MISCELLANEOUS) ×4 IMPLANT
SUT MNCRL AB 4-0 PS2 18 (SUTURE) ×4 IMPLANT
SUT PDS AB 1 CT1 27 (SUTURE) ×8 IMPLANT
SUT PDS AB 1 TP1 96 (SUTURE) IMPLANT
SUT PROLENE 0 CT 2 (SUTURE) IMPLANT
SUT PROLENE 2 0 KS (SUTURE) ×4 IMPLANT
SUT SILK 2 0 (SUTURE) ×4
SUT SILK 2-0 18XBRD TIE 12 (SUTURE) ×2 IMPLANT
SUT SILK 3 0 (SUTURE) ×4
SUT SILK 3 0 SH CR/8 (SUTURE) ×4 IMPLANT
SUT SILK 3-0 18XBRD TIE 12 (SUTURE) ×2 IMPLANT
SUT V-LOC BARB 180 2/0GR6 GS22 (SUTURE)
SUT VIC AB 3-0 SH 18 (SUTURE) IMPLANT
SUT VIC AB 3-0 SH 27 (SUTURE)
SUT VIC AB 3-0 SH 27XBRD (SUTURE) IMPLANT
SUT VICRYL 0 UR6 27IN ABS (SUTURE) ×4 IMPLANT
SUTURE V-LC BRB 180 2/0GR6GS22 (SUTURE) IMPLANT
SYR 10ML LL (SYRINGE) ×4 IMPLANT
SYS LAPSCP GELPORT 120MM (MISCELLANEOUS)
SYSTEM LAPSCP GELPORT 120MM (MISCELLANEOUS) IMPLANT
TAPE CLOTH 4X10 WHT NS (GAUZE/BANDAGES/DRESSINGS) ×2 IMPLANT
TOWEL OR NON WOVEN STRL DISP B (DISPOSABLE) ×4 IMPLANT
TRAY COLON PACK (CUSTOM PROCEDURE TRAY) ×4 IMPLANT
TRAY FOLEY MTR SLVR 16FR STAT (SET/KITS/TRAYS/PACK) ×4 IMPLANT
TROCAR ADV FIXATION 5X100MM (TROCAR) ×4 IMPLANT
TUBING CONNECTING 10 (TUBING) ×5 IMPLANT
TUBING CONNECTING 10' (TUBING) ×1
TUBING INSUFFLATION 10FT LAP (TUBING) ×4 IMPLANT

## 2019-07-23 NOTE — Op Note (Signed)
07/23/2019  3:36 PM  PATIENT:  Brian Avery  54 y.o. male  Patient Care Team: Brian Morning, DO as PCP - General (Family Medicine) Brian Finner, RN as Oncology Nurse Navigator Brian Pier, MD as Consulting Physician (Oncology) Brian Feller, RN as [facility] Management  PRE-OPERATIVE DIAGNOSIS:  Colon cancer  POST-OPERATIVE DIAGNOSIS:  Same  PROCEDURE: 1. Robotic assisted sigmoidectomy with double stapled colorectal anastomosis 2. Intraoperative assessment of perfusion 3. Flexible sigmoidoscopy 4. Bilateral transversus abdominus plane blocks  SURGEON:  Brian Mt. Chason Mciver, MD  ASSISTANT: Brian Ruff, MD  ANESTHESIA:   local and general  COUNTS:  Sponge, needle and instrument counts were reported correct x2 at the conclusion of the operation.  EBL: 50 mL  DRAINS: None  SPECIMEN: 1. Rectosigmoid colon - open end proximal 2. Distal anastomotic donut 3. Proximal anastomotic donut (has prolene stitch within)  COMPLICATIONS: None  FINDINGS: Tattoo in the proximal sigmoid colon with mass just proximal to this.  Normal-appearing liver and peritoneal lining.  No evidence of metastatic disease.  Long and redundant sigmoid.  This ultimately necessitated complete removal of the sigmoid with a descending colon to proximal rectum anastomosis.  This was done with a 31 mm EEA.  This rests 16 cm from the anal verge by flexible sigmoidoscopy.  DISPOSITION: PACU in satisfactory condition  INDICATION: Brian Avery is a very pleasant 52yoM who presented to our office as a referral for newly diagnosed colon cancer.  He underwent colonoscopy which was his first on 06/04/2019 which demonstrated mass in the sigmoid colon that was partially circumferential and 5 cm in length.  Biopsied and tattooed distally.  Biopsies returned adenocarcinoma.  Cecal polyp that was removed in a piecemeal manner returned as a sessile serrated adenoma.  He underwent staging CT  chest/abdomen/pelvis which demonstrated eccentric wall thickening along the proximal sigmoid colon as well as prominent lymph nodes in the adjacent mesocolon and a nonspecific "retroperitoneal" lymph node.  No other evidence of metastatic disease.  His case was presented at our multidisciplinary tumor board and it was felt by our radiologist that the nodal findings were all within the mesocolon.  We discussed options going forward and he opted to pursue surgery.  Please refer to notes elsewhere for details regarding his discussion.  DESCRIPTION: The patient was identified in preop holding and taken to the OR where he was placed on the operating room table. SCDs were placed. General endotracheal anesthesia was induced without difficulty.  A Foley catheter was placed under sterile conditions and hair on the abdomen was clipped by the OR team.  He was positioned in lithotomy using Allen stirrups.  Pressure points were padded and verified. He was then prepped and draped in the usual sterile fashion. A surgical timeout was performed indicating the correct patient, procedure, positioning and need for preoperative antibiotics.   An OG tube was placed to suction by anesthesia.  Following this, a Veress needle was used to gain access to the peritoneal cavity.  This was done at Palmer's point.  Insufflation commenced with CO2 to max pressure 15 mmHg.  Just to the right and cephalad of the umbilicus, an 8 mm blunt tipped robotic trocar was cautiously placed.  Laparoscope was inserted and demonstrated no evidence of trocar site or Veress needle site complications.  Under direct visualization, 3 additional 8 mm trochars were placed in a line extending from left upper quadrant to the right ASIS.  A 12 mm trocar was placed in  the suprapubic midline at the location of the planned Pfannenstiel, 3 fingerbreadths above the pubic symphysis.  A 5 mm assist port was placed in the right lateral abdomen. All under direct visualization.   The abdomen was surveyed and the tattoo identified in the proximal sigmoid colon.  The peritoneal lining the abdomen appeared normal.  The liver appeared normal.  The omentum appeared normal.  Bilateral transversus abdominis plane blocks were then created using dilute mixture of Exparel and Marcaine.  He was then positioned in Trendelenburg with some right side down.  Bowel was swept out of the pelvis.  The robot was then docked and I went to the console.  The sigmoid colon adhesions along the intersigmoid fossa were taken down.  A medial to lateral approach was initially pursued.  The rectosigmoid colon was grasped and elevated anteriorly.  The plane between the fascia propria the rectum and the presacral fascia was identified and the space was gained by opening the overlying peritoneum.  The hypogastric nerves were protected and swept "down."  The IMA pedicle was identified and the peritoneum overlying this was incised.  The left ureter was identified lateral to this dissection and swept "down."  The gonadal vessels were also identified lateral to the ureter and protected.  The IMA was again visualized and circumferentially dissected.  Location of the left ureter was again confirmed to be away from our field of dissection.  The IMA was then ligated approximately 1 cm from its takeoff from the aorta using the vessel sealer.  The stump was inspected and noted to be hemostatic.  Due to the location of the tumor in the proximal sigmoid as well as obtaining descending colon reach to the pelvis, the IMV was also circumferentially dissected.  The left ureter was confirmed to be away and then the IMV was ligated using the vessel sealer.  The stump was hemostatic.  The plane of dissection was continued mobilizing the descending mesocolon off of the retroperitoneum.  This commenced up to the level of the spleen.  Attention was then turned to the lateral portion of dissection.  The colon was grasped and retracted medially  and the sigmoid colon as well as the descending colon were completely mobilized by incising the Brian Avery line of Toldt.  This was carried up to the level of the splenic flexure.  At this point, there was more than adequate reach of the descending colon into the pelvis where it would remain without any tension or retraction.  Attention was turned to the distal point of transection.  The location where the tinea had splayed, there were loss of appendices epiploica, and an area overlying the sacral promontory was selected as the distal point of transection.  This corresponds to the proximal rectum.  The mesorectum was cleared out to the level of the rectum using the vessel sealer.  Attention was turned to the proximal point of transection.  The location of the tumor was confirmed and the mesentery was then divided using the vessel sealer working just proximal to the IMA pedicle and out to the level of the planned proximal point of transection.  After having cleared all the mesentery, attention was turned to assessing perfusion.  ICG was administered.  There was avid uptake of the fluorescein dye within the descending colon out to the level of the cleared mesentery.  The rectum was inspected and there was great perfusion out to the level of the cleared mesentery.  A 60 mm green load robotic stapler  was then used to divide the rectum at the junction of the sigmoid colon.  The staple line was hemostatic and intact.  Attention was then turned to the extracorporeal portion of the procedure.  The robot was undocked and I scrubbed back in  A Pfannenstiel incision was created in the subcutaneous tissue divided using electrocautery.  Incorporating the fascial defect from the 12 mm robotic trocar, the rectus fascia was incised.  Flaps were created cephalad and caudad.  The peritoneum was incised in the midline and opened.  An Alexis wound protector was placed and towels placed around the field.  The rectosigmoid staple line was  delivered.  The proximal point of transection was identified where the mesentery had been cleared.  This was grossly 5 cm proximal to the location of the tumor.  A pursestring clamp was then applied.  A 2-0 Prolene on a Keith needle was then passed.  The colon was transected and passed off as specimen with the open end being proximal.  The rectal stump was visible through our incision and pink and well perfused in appearance.  The proximal point of transection was also pink and well perfused.  There was a palpable pulse in the mesentery.  EEA sizers were then passed and a 31 mm EEA was selected.  3-0 silk "belt loop" sutures were then placed.  The anvil was then placed in the pursestring tied.  There was no significant amount of fatty tissue within the planned anastomosis.  There were no diverticula at this location either.  This was then placed back in the abdomen and a cap placed on the wound protector.  Pneumoperitoneum was reestablished.  I went below to pass the stapler.  Under direct visualization, the 31 mm EEA stapler was carefully passed to the rectal staple line.  The spike was deployed just anterior to our staple line.  The components were then mated and the orientation of the descending colon was confirmed such that there is no twisting nor small bowel underneath the mesentery.  The stapler was then closed, held, and fired.  The donuts were inspected and noted to be complete.  These were passed off individually the specimen.  The colon proximally to the anastomosis was gently occluded with a bowel clamp.  The pelvis was filled with sterile saline.  The flexible sigmoidoscope was introduced to the anus and the rectum was insufflated.  This was then advanced to the level of the anastomosis which is 16 cm from the anal verge.  The anastomosis was complete, hemostatic, and well perfused in appearance.  There is no air bubbles on the abdominal side.  The sigmoidoscope was then removed.  The laparoscopic  trochars were all removed under direct visualization and the port sites hemostatic.  The wound protector cap was removed.  We then changed over to clean instruments/gloves/gowns for closing portion of the procedure.  All counts were reported correct.  Irrigation from within the pelvis was evacuated.  Hemostasis was appreciated.  The peritoneum was then closed using a running 0 Vicryl suture.  The fascia was closed with 2 running #1 PDS sutures.  The fascia was palpated and noted to be completely closed.  The subcutaneous tissues were irrigated.  Sponge, needle, and instrument counts were again reported correct.  The skin of all incision sites was then closed with 4-0 Monocryl subcuticular sutures.  Dermabond was applied to all wounds.  A honeycomb was applied to the Pfannenstiel as well.  He was then taken out of  lithotomy, awakened from anesthesia, extubated, transferred to a stretcher for transport to PACU in satisfactory condition.

## 2019-07-23 NOTE — Anesthesia Procedure Notes (Signed)
Procedure Name: Intubation Date/Time: 07/23/2019 12:27 PM Performed by: Deliah Boston, CRNA Pre-anesthesia Checklist: Patient identified, Emergency Drugs available, Suction available and Patient being monitored Patient Re-evaluated:Patient Re-evaluated prior to induction Oxygen Delivery Method: Circle system utilized Preoxygenation: Pre-oxygenation with 100% oxygen Induction Type: IV induction Ventilation: Mask ventilation without difficulty Laryngoscope Size: Mac and 4 Grade View: Grade I Tube type: Oral Tube size: 7.5 mm Number of attempts: 1 Airway Equipment and Method: Stylet and Oral airway Placement Confirmation: ETT inserted through vocal cords under direct vision,  positive ETCO2 and breath sounds checked- equal and bilateral Secured at: 22 cm Tube secured with: Tape Dental Injury: Teeth and Oropharynx as per pre-operative assessment

## 2019-07-23 NOTE — Transfer of Care (Signed)
Immediate Anesthesia Transfer of Care Note  Patient: Brian Avery  Procedure(s) Performed: Procedure(s): ROBOTIC SIGMOIDECTOMY, INTRAOPERATIVE ASSESSMENT OF PERFUSION, TAP BLOCK (N/A) FLEXIBLE SIGMOIDOSCOPY (N/A)  Patient Location: PACU  Anesthesia Type:General  Level of Consciousness: Patient easily awoken, sedated, comfortable, cooperative, following commands, responds to stimulation.   Airway & Oxygen Therapy: Patient spontaneously breathing, ventilating well, oxygen via simple oxygen mask.  Post-op Assessment: Report given to PACU RN, vital signs reviewed and stable, moving all extremities.   Post vital signs: Reviewed and stable.  Complications: No apparent anesthesia complications  Last Vitals:  Vitals Value Taken Time  BP 123/75 07/23/19 1540  Temp    Pulse 78 07/23/19 1542  Resp 16 07/23/19 1542  SpO2 100 % 07/23/19 1542  Vitals shown include unvalidated device data.  Last Pain:  Vitals:   07/23/19 1033  TempSrc:   PainSc: 0-No pain         Complications: No apparent anesthesia complications

## 2019-07-23 NOTE — H&P (Signed)
CC: Referred for newly diagnosed colon cancer  HPI: Mr. Alkire is a very pleasant 72yoM who presents for evaluation of a newly diagnosed colon cancer. He reports occasional spots of blood with bowel movements that he has attributed to internal hemorrhoids. He underwent his first colonoscopy 06/04/19 with Dr. Silverio Decamp. This is notable for a 15 mm polyp in the cecum that was removed in a piecemeal manner. A mass in the sigmoid colon was partially circumferential measuring 5 cm in length-28-33 cm from the verge. Biopsy and tattooed (distally). Biopsies returned adenocarcinoma. Cecal polyp was a sessile serrated adenoma. He underwent staging CT chest/abdomen/pelvis 06/05/19 which demonstrates eccentric wall thickening of the proximal sigmoid colon in keeping with the reported mass. Prominent subcentimeter lymph nodes in the adjacent mesocolon up to 5 mm. Prominent nonspecific retroperitoneal lymph nodes measuring 1 x 1 cm. No other evidence of metastatic disease. He denies any specific symptoms related to this site from the occasional drops of blood. He denies any nausea/vomiting/abdominal pain.  He denies any changes in his health since we met in the office.  He has been doing well.  He denies any complaints today.  He took his bowel prep well.  PMH: Denies  PSH: Denies any prior abdominal or pelvic surgical history. Reports having had a benign/reactive lymph node removed from his neck many years ago  FHx: Mother with breast cancer recently and aunt on same side with ovarian cancer.  Social: Denies use of tobacco/drugs; occasional social etoh use. 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   Past Medical History:  Diagnosis Date    Arthritis    Cancer [facility])    Family history of breast cancer    Family history of colon cancer    Family history of ovarian cancer    Sarcoidosis of lung ([place]) 1998    Past Surgical History:  Procedure Laterality Date   KNEE ARTHROSCOPY  2010   right   KNEE ARTHROSCOPY Right 01/30/2016   Procedure: ARTHROSCOPY KNEE;  Surgeon: Netta Cedars, MD;  Location: [facility];  Service: Orthopedics;  Laterality: Right;   NECK SURGERY     lymphnode removed    Family History  Problem Relation Age of Onset   Breast cancer Mother 25       second dx at 13   Factor V Leiden deficiency Mother    Hypertension Father    Colon cancer Maternal Grandfather 60       d. 64   Ovarian cancer Sister 64   Prostate cancer Maternal Uncle 10       d. 74   Dementia Paternal Uncle    Heart disease Maternal Grandmother    Lung cancer Paternal Grandfather    Colon cancer Maternal Uncle 82   Factor V Leiden deficiency Maternal Uncle    Thrombosis Maternal Uncle        due to FVL   Esophageal cancer Neg Hx    Stomach cancer Neg Hx    Rectal cancer Neg Hx     Social:  reports that he has never smoked. He has quit using smokeless tobacco. He reports current alcohol use of about 4.0 standard drinks of alcohol per week. He reports that he does not use drugs.  Allergies: No Known Allergies  Medications: I have reviewed the patient's current medications.  No results found for this or any previous visit (from the past 48 hour(s)).  No results found.  ROS - all of the below systems have been reviewed with the patient and positives are indicated with bold text General: chills, fever or night sweats Eyes: blurry vision or double vision ENT: epistaxis or sore throat Allergy/Immunology: itchy/watery eyes or nasal congestion Hematologic/Lymphatic: bleeding problems, blood clots or swollen lymph nodes Endocrine: temperature intolerance or unexpected weight changes Breast: new or changing  breast lumps or nipple discharge Resp: cough, shortness of breath, or wheezing CV: chest pain or dyspnea on exertion GI: as per HPI GU: dysuria, trouble voiding, or hematuria MSK: joint pain or joint stiffness Neuro: TIA or stroke symptoms Derm: pruritus and skin lesion changes Psych: anxiety and depression  PE Blood pressure (!) 131/93, pulse 70, temperature 98.7 F (37.1 C), temperature source Oral, resp. rate 18, height 6' 1"  (1.854 m), weight 94.9 kg, SpO2 100 %. Constitutional: NAD; conversant Eyes: Moist conjunctiva; no lid lag; anicteric Lungs: Normal respiratory effort CV: RRR GI: Abd soft, NT/ND; no palpable hepatosplenomegaly MSK: Normal range of motion of extremities;  Psychiatric: Appropriate affect; alert and oriented x3  No results found for this or any previous visit (from the past 48 hour(s)).  No results found.   A/P: Mr. Veltre is a very pleasant 2yoM with newly diagnosed proximal sigmoid colon cancer - CTs show suspicious lymph nodes in mesentery and additionally in retroperitoneum -The anatomy and physiology of the GI tract was discussed at length with the patient. The pathophysiology of colon cancer was discussed at length as well. -We reviewed robotic sigmoidectomy/left hemicolectomy; flexible sigmoidoscopy; potential open techniques as well. Lymph nodes as per MDT discussions appear within planned field of sigmoidectomy -The planned procedure, material risks (including, but not limited to, pain, bleeding, infection, scarring, need for blood transfusion, damage to surrounding structures- blood vessels/nerves/viscus/organs, damage to ureter, urine leak, leak from anastomosis, need for additional procedures, worsening of pre-existing medical conditions, need for stoma which may be permanent, hernia, recurrence of cancer, DVT/PE, pneumonia, heart attack, stroke, death) benefits and alternatives to surgery were discussed at length. I noted a good probability that the  procedure would help improve their symptoms. The patient's questions were answered to his satisfaction, he voiced understanding and elected to proceed with surgery. Additionally, we discussed typical postoperative expectations and the recovery process. We also reviewed potential surveillance algorithms as per NCCN and potential indications for chemotherapy as well  Sharon Mt. Dema Severin, M.D. [facility] Use [email or web address] to contact on call provider

## 2019-07-23 NOTE — Anesthesia Postprocedure Evaluation (Signed)
Anesthesia Post Note  Patient: Brian Avery  Procedure(s) Performed: ROBOTIC SIGMOIDECTOMY, INTRAOPERATIVE ASSESSMENT OF PERFUSION, TAP BLOCK (N/A ) FLEXIBLE SIGMOIDOSCOPY (N/A )     Patient location during evaluation: PACU Anesthesia Type: General Level of consciousness: awake and alert and oriented Pain management: pain level controlled Vital Signs Assessment: post-procedure vital signs reviewed and stable Respiratory status: spontaneous breathing, nonlabored ventilation and respiratory function stable Cardiovascular status: blood pressure returned to baseline Postop Assessment: no apparent nausea or vomiting Anesthetic complications: no    Last Vitals:  Vitals:   07/23/19 1545 07/23/19 1600  BP: 108/76 116/82  Pulse: 70 72  Resp: 16 16  Temp: 36.6 C   SpO2: 100% 99%    Last Pain:  Vitals:   07/23/19 1600  TempSrc:   PainSc: [place]

## 2019-07-24 LAB — CBC
HCT: 41.4 % (ref 39.0–52.0)
Hemoglobin: 13.5 g/dL (ref 13.0–17.0)
MCH: 32.3 pg (ref 26.0–34.0)
MCHC: 32.6 g/dL (ref 30.0–36.0)
MCV: 99 fL (ref 80.0–100.0)
Platelets: 253 10*3/uL (ref 150–400)
RBC: 4.18 MIL/uL — ABNORMAL LOW (ref 4.22–5.81)
RDW: 13.7 % (ref 11.5–15.5)
WBC: 10.5 10*3/uL (ref 4.0–10.5)
nRBC: 0 % (ref 0.0–0.2)

## 2019-07-24 LAB — BASIC METABOLIC PANEL
Anion gap: 11 (ref 5–15)
BUN: 8 mg/dL (ref 6–20)
CO2: 23 mmol/L (ref 22–32)
Calcium: 8.7 mg/dL — ABNORMAL LOW (ref 8.9–10.3)
Chloride: 103 mmol/L (ref 98–111)
Creatinine, Ser: 0.91 mg/dL (ref 0.61–1.24)
GFR calc Af Amer: >60 mL/min (ref >60)
GFR calc non Af Amer: >60 mL/min (ref >60)
Glucose, Bld: 170 mg/dL — ABNORMAL HIGH (ref 70–99)
Potassium: 3.5 mmol/L (ref 3.5–5.1)
Sodium: 137 mmol/L (ref 135–145)

## 2019-07-24 NOTE — Progress Notes (Signed)
Subjective No acute events. Doing quite well. Denies complaints this morning. Has been passing gas and has had 2 BM. Denies n/v. Tolerating liquids without issue. Pain well controlled, ambulating  Objective: Vital signs in last 24 hours: Temp:  [97.8 F (36.6 C)-98.9 F (37.2 C)] 98.4 F (36.9 C) (06/08 0901) Pulse Rate:  [62-86] 71 (06/08 0901) Resp:  [13-20] 18 (06/08 0901) BP: (103-133)/(61-93) 113/61 (06/08 0901) SpO2:  [98 %-100 %] 100 % (06/08 0901) Weight:  [94.9 kg-98.1 kg] 98.1 kg (06/08 0100) Last BM Date: 07/23/19  Intake/Output from previous day: 06/07 0701 - 06/08 0700 In: 2140 [P.O.:740; I.V.:1300; IV Piggyback:100] Out: 2880 [Urine:2830; Blood:50] Intake/Output this shift: Total I/O In: -  Out: 300 [Urine:300]  Gen: NAD, comfortable CV: RRR Pulm: Normal work of breathing Abd: Soft, NT/ND; incisions c/d/i without erythema Ext: SCDs in place  Lab Results: CBC  Recent Labs    07/24/19 0550  WBC 10.5  HGB 13.5  HCT 41.4  PLT 253   BMET Recent Labs    07/24/19 0550  NA 137  K 3.5  CL 103  CO2 23  GLUCOSE 170*  BUN 8  CREATININE 0.91  CALCIUM 8.7*   PT/INR No results for input(s): LABPROT, INR in the last 72 hours. ABG No results for input(s): PHART, HCO3 in the last 72 hours.  Invalid input(s): PCO2, PO2  Studies/Results:  Anti-infectives: Anti-infectives (From admission, onward)   Start     Dose/Rate Route Frequency Ordered Stop   07/23/19 1400  neomycin (MYCIFRADIN) tablet 1,000 mg  Status:  Discontinued     1,000 mg Oral 3 times per day 07/23/19 1021 07/23/19 1023   07/23/19 1400  metroNIDAZOLE (FLAGYL) tablet 1,000 mg  Status:  Discontinued     1,000 mg Oral 3 times per day 07/23/19 1021 07/23/19 1023   07/23/19 1030  cefoTEtan (CEFOTAN) 2 g in sodium chloride 0.9 % 100 mL IVPB     2 g 200 mL/hr over 30 Minutes Intravenous On call to O.R. 07/23/19 1021 07/23/19 1216       Assessment/Plan: Patient Active Problem List    Diagnosis Date Noted  . Status post laparoscopic-assisted sigmoidectomy 07/23/2019  . Genetic testing 07/17/2019  . Colon cancer ([place]) 06/27/2019  . Family history of colon cancer   . Family history of breast cancer   . Family history of ovarian cancer    s/p Procedure(s): ROBOTIC SIGMOIDECTOMY, INTRAOPERATIVE ASSESSMENT OF PERFUSION, TAP BLOCK FLEXIBLE SIGMOIDOSCOPY 07/23/2019  -Spent time reviewing procedure with him and his wife yesterday and today; answered related questions -Advance to full liquids, then to soft if tolerating well -Ambulate -D/C Entereg -PPx: SQH, SCDs   LOS: 1 day   Sharon Mt. Dema Severin, M.D. [facility]

## 2019-07-25 MED ORDER — TRAMADOL HCL 50 MG PO TABS: 50.0000 mg | ORAL_TABLET | Freq: Four times a day (QID) | ORAL | 0 refills | Status: DC | PRN

## 2019-07-25 NOTE — Plan of Care (Signed)
Patient was discharged home today. All questions were answered and patient was taken to main lobby by NT.

## 2019-07-25 NOTE — Discharge Summary (Signed)
Patient ID: Brian Avery MRN: [id number] DOB/AGE: 1965-06-05 54 y.o.  Admit date: 07/23/2019 Discharge date: 07/25/2019  Discharge Diagnoses Patient Active Problem List   Diagnosis Date Noted  . Status post laparoscopic-assisted sigmoidectomy 07/23/2019  . Genetic testing 07/17/2019  . Colon cancer ([place]) 06/27/2019  . Family history of colon cancer   . Family history of breast cancer   . Family history of ovarian cancer     Procedures OR 07/23/19  PROCEDURE: 1. Robotic assisted sigmoidectomy with double stapled colorectal anastomosis 2. Intraoperative assessment of perfusion 3. Flexible sigmoidoscopy 4. Bilateral transversus abdominus plane blocks  Hospital Course: He was admitted postoperatively where he recovered well. His diet was advanced and he was ambulating freely on his own. Foley removed and voiding well on his own. His pain was well controlled on oral analgesics. He began having spontaneous bowel function and was tolerating a soft diet without difficulty. On 07/25/19, he was comfortable with discharge and deemed stable for discharge home.  AF VSS NAD, comfortable and in good spirits RRR Abdomen is soft, nontender, nondistended; incisions c/d/i without erythema or drainage    Allergies as of 07/25/2019   No Known Allergies     Medication List    STOP taking these medications   metroNIDAZOLE 500 MG tablet Commonly known as: FLAGYL   neomycin 500 MG tablet Commonly known as: MYCIFRADIN   Suprep Bowel Prep Kit 17.5-3.13-1.6 GM/177ML Soln Generic drug: Na Sulfate-K Sulfate-Mg Sulf     TAKE these medications   traMADol 50 MG tablet Commonly known as: Ultram Take 1 tablet (50 mg total) by mouth every 6 (six) hours as needed (postop pain not controlled with tylenol and ibuprofen).        Follow-up Information    Ileana Roup, MD Follow up in 2 week(s).   Specialty: General Surgery Contact information: [place]  [address] [phone]           Brian Avery, M.D. [facility]

## 2019-07-25 NOTE — Discharge Instructions (Addendum)
POST OP INSTRUCTIONS AFTER COLON SURGERY  1. DIET: Be sure to include lots of fluids daily to stay hydrated - 64oz of water per day (8, 8 oz glasses).  Avoid fast food or heavy meals for the first couple of weeks as your are more likely to get nauseated. Avoid raw/uncooked fruits or vegetables for the first 4 weeks (its ok to have these if they are blended into smoothie form). If you have fruits/vegetables, make sure they are cooked until soft enough to mash on the roof of your mouth and chew your food well. Otherwise, diet as tolerated.  2. Take your usually prescribed home medications unless otherwise directed.  3. PAIN CONTROL: a. Pain is best controlled by a usual combination of three different methods TOGETHER: i. Ice/Heat ii. Over the counter pain medication iii. Prescription pain medication b. Most patients will experience some swelling and bruising around the surgical site.  Ice packs or heating pads (30-60 minutes up to 6 times a day) will help. Some people prefer to use ice alone, heat alone, alternating between ice & heat.  Experiment to what works for you.  Swelling and bruising can take several weeks to resolve.   c. It is helpful to take an over-the-counter pain medication regularly for the first few weeks: i. Ibuprofen (Motrin/Advil) - 200mg  tabs - take 3 tabs (600mg ) every 6 hours as needed for pain (unless you have been directed previously to avoid NSAIDs/ibuprofen) ii. Acetaminophen (Tylenol) - you may take 650mg  every 6 hours as needed. You can take this with motrin as they act differently on the body. If you are taking a narcotic pain medication that has acetaminophen in it, do not take over the counter tylenol at the same time. iii. NOTE: You may take both of these medications together - most patients  find it most helpful when alternating between the two (i.e. Ibuprofen at 6am, tylenol at 9am, ibuprofen at 12pm ...) d. A  prescription for pain medication should be given to you  upon discharge.  Take your pain medication as prescribed if your pain is not adequatly controlled with the over-the-counter pain reliefs mentioned above.  4. Avoid getting constipated.  Between the surgery and the pain medications, it is common to experience some constipation.  Increasing fluid intake and taking a fiber supplement (such as Metamucil, Citrucel, FiberCon, MiraLax, etc) 1-2 times a day regularly will usually help prevent this problem from occurring.  A mild laxative (prune juice, Milk of Magnesia, MiraLax, etc) should be taken according to package directions if there are no bowel movements after 48 hours.    5. Dressing: Your incisions are covered in Dermabond which is like sterile superglue for the skin. This will come off on it's own in a couple weeks. It is waterproof and you may bathe normally starting the day after your surgery in a shower. Avoid baths/pools/lakes/oceans until your wounds have fully healed. The dressing over the low midline incision may be removed tomorrow 07/26/19.  6. ACTIVITIES as tolerated:   a. Avoid heavy lifting (>10lbs or 1 gallon of milk) for the next 6 weeks. b. You may resume regular daily activities as tolerated--such as daily self-care, walking, climbing stairs--gradually increasing activities as tolerated.  If you can walk 30 minutes without difficulty, it is safe to try more intense activity such as jogging, treadmill, bicycling, low-impact aerobics.  c. DO NOT PUSH THROUGH PAIN.  Let pain be your guide: If it hurts to do something, don't do it. d. Brian Avery  may drive when you are no longer taking prescription pain medication, you can comfortably wear a seatbelt, and you can safely maneuver your car and apply brakes. e. Brian Avery may have sexual intercourse when it is comfortable.   7. FOLLOW UP in our office a. Please call [facility] at [phone] to set up an appointment to see your surgeon in the office for a follow-up appointment approximately 2 weeks after your  surgery. b. Make sure that you call for this appointment the day you arrive home to insure a convenient appointment time.  9. If you have disability or family leave forms that need to be completed, you may have them completed by your primary care physician's office; for return to work instructions, please ask our office staff and they will be happy to assist you in obtaining this documentation   When to call us [phone]: 1. Poor pain control 2. Reactions / problems with new medications (rash/itching, etc)  3. Fever over 101.5 F (38.5 C) 4. Inability to urinate 5. Nausea/vomiting 6. Worsening swelling or bruising 7. Continued bleeding from incision. 8. Increased pain, redness, or drainage from the incision  The clinic staff is available to answer your questions during regular business hours (8:30am-5pm).  Please don't hesitate to call and ask to speak to one of our nurses for clinical concerns.   A surgeon from [facility] is always on call at the hospitals   If you have a medical emergency, go to the nearest emergency room or call 911.  [facility], [place] [address], [place], [place], [place]  [address] MAIN: [phone] FAX: [phone] [email or web address]

## 2019-07-26 LAB — SURGICAL PATHOLOGY

## 2019-07-27 ENCOUNTER — Other Ambulatory Visit: Payer: Self-pay | Admitting: *Deleted

## 2019-07-27 ENCOUNTER — Encounter: Payer: Self-pay | Admitting: *Deleted

## 2019-07-27 NOTE — Patient Outreach (Signed)
Brian Avery [facility]) Care Management  07/27/2019  Brian Avery [birth year] [id number]   Transition of care call/case closure   Referral received:07/23/19 Initial outreach:07/27/19 Insurance: [facility] plan   Subjective: Initial successful telephone call to patient's preferred number in order to complete transition of care assessment; 2 HIPAA identifiers verified. Explained purpose of call and completed transition of care assessment.  Brian Avery states that he is doing well, surprised by he quick recovery process after surgery. He reports that his pain is 2/3 managed well with prn tylenol and occasional use of Tramadol. Her denies post-operative problems, says surgical incisions are unremarkable. He discussed receiving phone call on yesterday from surgeon with report of lymph nodes no cancer has follow up with oncology in next week.   He is  tolerating diet, denies bowel or bladder problems report having regular bowel movement. He states tolerating mobility in home, tolerating showering .  Spouse is  asisting with his  Recovery.   Discussed accessing the following [facility] Benefits: States that they do not have the hospital indemnity He  [facility]   Objective:  Brian Avery  was hospitalized at [facility]  From 6/7-07/25/19  for Comorbidities include:  He was discharged to home on 07/25/19 without the need for home health services or DME.   Assessment:  Patient voices good understanding of all discharge instructions.  See transition of care flowsheet for assessment details.   Plan:  Reviewed hospital discharge diagnosis of Robotic Sigmoidectomy   and discharge treatment plan using hospital discharge instructions, assessing medication adherence, reviewing problems requiring provider notification, and discussing the importance of follow up with surgeon, primary care provider and/or specialists as directed.  Reviewed [place]  healthy lifestyle program information to receive discounted premium for  2022   Step 1: Get  your annual physical  Step 2: Complete your health assessment  Step 3:Identify your current health status and complete the corresponding action step between January 1, and October 17, 2019.     No ongoing care management needs identified so will close case to [place] [facility] services and route successful outreach letter with [place] [facility] pamphlet and 24 Hour Nurse Line Magnet to [facility] [facility] clinical pool to be mailed to patient's home address.    Joylene Draft, RN, BSN  [facility] Management Coordinator  [phone]- Mobile [phone]- Toll Free Main Office

## 2019-07-31 MED FILL — GABAPENTIN 300 MG CAPSULE: 300 | 11 days supply | Qty: 30 | Fill #0

## 2019-08-01 ENCOUNTER — Other Ambulatory Visit: Payer: Self-pay

## 2019-08-02 ENCOUNTER — Telehealth: Payer: Self-pay | Admitting: Oncology

## 2019-08-02 ENCOUNTER — Inpatient Hospital Stay: Payer: 59 | Attending: Oncology | Admitting: Oncology

## 2019-08-02 ENCOUNTER — Other Ambulatory Visit: Payer: Self-pay

## 2019-08-02 VITALS — BP 124/90 | HR 62 | Temp 98.1°F | Resp 18 | Ht 73.0 in | Wt 211.5 lb

## 2019-08-02 DIAGNOSIS — D125 Benign neoplasm of sigmoid colon: Secondary | ICD-10-CM | POA: Diagnosis not present

## 2019-08-02 DIAGNOSIS — Z803 Family history of malignant neoplasm of breast: Secondary | ICD-10-CM | POA: Insufficient documentation

## 2019-08-02 DIAGNOSIS — C187 Malignant neoplasm of sigmoid colon: Secondary | ICD-10-CM

## 2019-08-02 MED ORDER — CAPECITABINE 500 MG PO TABS
ORAL_TABLET | ORAL | 0 refills | Status: DC
Start: 2019-08-13 — End: 2019-08-06

## 2019-08-02 NOTE — Progress Notes (Signed)
[facility] OFFICE PROGRESS NOTE   Diagnosis: Colon cancer  INTERVAL HISTORY:   Mr. Klingensmith underwent a robotic assisted sigmoidectomy by Dr. Cliffton Asters on 07/23/2019.  He was discharged in the hospital on 07/25/2019.  At the time of surgery a tattoo was noted in the proximal sigmoid colon with a mass.  No evidence of metastatic disease.  The pathology [id number]) revealed a well differentiated adenocarcinoma of the proximal sigmoid colon.  Tumor extended through the muscularis propria.  The resection margins are negative.  No macroscopic tumor perforation.  Lymphovascular invasion is present.  No perineural invasion.  No tumor deposits.  0/23 lymph nodes returned positive for metastatic carcinoma.  No loss of mismatch repair protein expression.   He reports a good appetite.  His bowels are functioning.  He has discomfort in the suprapubic area when standing.  He describes a burning discomfort to the right of midline and inferior to the low transverse incision.  No fever. Objective:  Vital signs in last 24 hours:  Blood pressure 124/90, pulse 62, temperature 98.1 F (36.7 C), temperature source Temporal, resp. rate 18, height 6\' 1"  (1.854 m), weight 211 lb 8 oz (95.9 kg), SpO2 100 %.    Resp: Lungs clear bilaterally Cardio: Regular rate and rhythm GI: No hepatosplenomegaly, no mass, nontender, healed surgical incisions.  Mild tenderness superior to the right side of the pubis.  No fluctuance.  No erythema.  No apparent hernia. Vascular: No leg edema     Lab Results:  Lab Results  Component Value Date   WBC 10.5 07/24/2019   HGB 13.5 07/24/2019   HCT 41.4 07/24/2019   MCV 99.0 07/24/2019   PLT 253 07/24/2019   NEUTROABS 3.4 07/18/2019    CMP  Lab Results  Component Value Date   NA 137 07/24/2019   K 3.5 07/24/2019   CL 103 07/24/2019   CO2 23 07/24/2019   GLUCOSE 170 (H) 07/24/2019   BUN 8 07/24/2019   CREATININE 0.91 07/24/2019   CALCIUM 8.7 (L)  07/24/2019   PROT 7.0 07/18/2019   ALBUMIN 4.4 07/18/2019   AST 47 (H) 07/18/2019   ALT 43 07/18/2019   ALKPHOS 43 07/18/2019   BILITOT 0.5 07/18/2019   GFRNONAA >60 07/24/2019   GFRAA >60 07/24/2019    Medications: I have reviewed the patient's current medications.   Assessment/Plan: 1. Sigmoid colon cancer, stage II (T3N0)  Colonoscopy 06/04/2019-cecum polyp, mass in the sigmoid colon beginning at 28 cm, biopsy confirmed a sessile serrated adenoma of the cecum, adenocarcinoma at the sigmoid colon  CTs 06/05/2019-eccentric wall thickening of the proximal sigmoid colon with subcentimeter lymph nodes in the adjacent mesocolon, prominent nonspecific retroperitoneal nodes-largest 1 x 1 cm  Robotic assisted sigmoidectomy 07/23/2019, well differentiated adenocarcinoma of the proximal sigmoid colon, tumor extends through the muscularis propria, positive lymphovascular invasion, negative resection margins, no loss of mismatch repair protein expression 2. Remote history of sarcoidosis-pulmonary and skin, he reports treatment with prednisone with resolution 3. Family history of multiple cancers including breast, ovarian, and a "gastrointestinal "malignancy    Disposition: Mr. Shillingford has been diagnosed with stage II colon cancer after undergoing a sigmoidectomy on 07/23/2019.  I reviewed details of the surgical pathology report with him today.  We discussed the prognosis and indication for adjuvant systemic therapy.  I explained patients with resected stage II colon cancer have a good prognosis in general.  His tumor had only lymphovascular invasion is a high risk feature.  His case was presented  at the GI tumor conference yesterday and review of the histology indicates a T3 lesion with an area that comes close to the serosa.  I discussed the expected small absolute benefit from adjuvant systemic therapy in his case.  We discussed the expected benefit from adjuvant 5-fluorouracil-based therapy and  the addition of oxaliplatin.  He would like to proceed with adjuvant chemotherapy.  I recommend capecitabine.  He agrees.   We reviewed potential toxicities associated with capecitabine including the chance for nausea, mucositis, diarrhea, alopecia, and hematologic toxicity.  We discussed the rash, sun sensitivity, hyperpigmentation, and hand/foot syndrome associated with capecitabine.  His case was presented at the GI tumor conference.  The consensus opinion is to proceed with adjuvant capecitabine.  There were no concerning findings upon review of the preoperative CTs.  He does not appear to have hereditary nonpolyposis colon cancer syndrome, but his family numbers are at increased risk of developing colorectal cancer and should receive appropriate screening.  We discussed the recommendation for surveillance colonoscopy.  We discussed diet and exercise maneuvers that may decrease the risk of developing colorectal cancer.  The plan is to begin adjuvant capecitabine on 08/13/2019.  We will check a CEA when he returns for the next office visit.  We will discuss the indication for surveillance to be scheduled a 6-43-monthinterval.  GBetsy Coder MD  08/02/2019  4:28 PM

## 2019-08-02 NOTE — Telephone Encounter (Signed)
Scheduled per 6/17 los. Pt aware of appts on 7/14. No avs or calendar needed. Mychart active.

## 2019-08-03 ENCOUNTER — Encounter: Payer: Self-pay | Admitting: *Deleted

## 2019-08-03 ENCOUNTER — Other Ambulatory Visit: Payer: Self-pay | Admitting: *Deleted

## 2019-08-03 ENCOUNTER — Telehealth: Payer: Self-pay

## 2019-08-03 NOTE — Telephone Encounter (Signed)
Oral Oncology Patient Advocate Encounter  Received notification from [facility] that prior authorization for Xeloda  is required.  PA submitted on CoverMyMeds Key [id number] Status is pending  Oral Oncology Clinic will continue to follow.  [place] Patient [place] Phone [phone] Fax [phone] 08/03/2019 8:33 AM

## 2019-08-03 NOTE — Patient Outreach (Signed)
[facility]) Care Management  08/03/2019  Brian Avery [birth year] [id number]  Late entry for 08/02/19  Monthly telephonic UMR case management review with [facility] RNCMs and Dr Alonza Bogus. Update provided to include: Transition of care follow up call progress and follow up  oncology regarding treatment plan scheduled for 08/02/19    Joylene Draft, RN, BSN  [place] Management Coordinator  [phone]- Mobile [phone]- [place]

## 2019-08-03 NOTE — Progress Notes (Signed)
Per Dr. Benay Spice request: Email to [facility]ology requesting MSI testing on case #[id number] dated 07/23/19.

## 2019-08-06 ENCOUNTER — Telehealth: Payer: Self-pay | Admitting: Pharmacist

## 2019-08-06 DIAGNOSIS — C187 Malignant neoplasm of sigmoid colon: Secondary | ICD-10-CM

## 2019-08-06 MED ORDER — CAPECITABINE 500 MG PO TABS: 2000.0000 mg | ORAL_TABLET | Freq: Two times a day (BID) | ORAL | 0 refills | Status: DC

## 2019-08-06 MED FILL — CAPECITABINE 500 MG TABLET: 500 | 21 days supply | Qty: 112 | Fill #0

## 2019-08-06 NOTE — Telephone Encounter (Signed)
Oral Oncology Pharmacist Encounter  Received new prescription for Xeloda (capecitabine) for the adjuvant treatment of stage II colon cancer. Planned start 08/13/19.  BMP from 07/24/19 assessed, no relevant lab abnormalities. Prescription dose and frequency assessed.   Current medication list in Epic reviewed, no DDIs with capecitabine identified.  Prescription has been e-scribed to the [facility] for benefits analysis and approval.  Oral Oncology Clinic will continue to follow for insurance authorization, copayment issues, initial counseling and start date.  Darl Pikes, PharmD, BCPS, BCOP, CPP Hematology/Oncology Clinical Pharmacist Practitioner [facility] [place] [facility] [phone]  08/06/2019 12:17 PM

## 2019-08-06 NOTE — Telephone Encounter (Signed)
Oral Chemotherapy Pharmacist Encounter  Patient plans on picking up from [place] today 08/06/19. He knows the plan is to get started on 08/13/19.  Patient Education I spoke with patient for overview of new oral chemotherapy medication: Xeloda (capecitabine) for the adjuvant treatment of stage II colon cancer. Planned start 08/13/19.   Pt is doing well. Counseled patient on administration, dosing, side effects, monitoring, drug-food interactions, safe handling, storage, and disposal. Patient will take 4 tablets (2,000 mg total) by mouth 2 (two) times daily after a meal. Take for 14 days, then hold for 7 days. Repeat every 21 days.  Side effects include but not limited to: diarrhea, hand-foot syndrome, edema, decreased wbc, fatigue, N/V.    Reviewed with patient importance of keeping a medication schedule and plan for any missed doses.  Brian Avery voiced understanding and appreciation. All questions answered. Medication handout placed in the mail.  Provided patient with [facility] [place] [facility] phone number. Patient knows to call the office with questions or concerns. Oral Chemotherapy Navigation Clinic will continue to follow.  Darl Pikes, PharmD, BCPS, BCOP, CPP Hematology/Oncology Clinical Pharmacist Practitioner [facility] [place] [facility] [phone]  08/06/2019 4:34 PM

## 2019-08-06 NOTE — Telephone Encounter (Signed)
Oral Oncology Patient Advocate Encounter  Prior Authorization for Xeloda has been approved.    PA# [id number] Effective dates: 08/03/19 through 02/15/20  Patients co-pay is $15.26  Oral Oncology Clinic will continue to follow.   [place] Patient [place] Phone [phone] Fax [phone] 08/06/2019 1:09 PM

## 2019-08-13 ENCOUNTER — Other Ambulatory Visit: Payer: Self-pay

## 2019-08-13 MED ORDER — ONDANSETRON HCL 8 MG PO TABS
8.0000 mg | ORAL_TABLET | Freq: Three times a day (TID) | ORAL | 0 refills | Status: DC | PRN
Start: 2019-08-13 — End: 2020-06-09

## 2019-08-13 NOTE — Progress Notes (Signed)
Sent in Zofran to the [facility] in [place], [place] as requested by patient's wife Estill Bamberg.

## 2019-08-28 ENCOUNTER — Other Ambulatory Visit: Payer: Self-pay

## 2019-08-28 ENCOUNTER — Other Ambulatory Visit: Payer: Self-pay | Admitting: Oncology

## 2019-08-28 DIAGNOSIS — C187 Malignant neoplasm of sigmoid colon: Secondary | ICD-10-CM

## 2019-08-28 NOTE — Progress Notes (Signed)
medicaton refill

## 2019-08-28 NOTE — Telephone Encounter (Signed)
Medication refill Brian Avery

## 2019-08-29 ENCOUNTER — Inpatient Hospital Stay: Payer: 59 | Attending: Oncology | Admitting: Nurse Practitioner

## 2019-08-29 ENCOUNTER — Other Ambulatory Visit: Payer: Self-pay

## 2019-08-29 ENCOUNTER — Encounter: Payer: Self-pay | Admitting: Nurse Practitioner

## 2019-08-29 ENCOUNTER — Inpatient Hospital Stay: Payer: 59

## 2019-08-29 ENCOUNTER — Telehealth: Payer: Self-pay | Admitting: Nurse Practitioner

## 2019-08-29 VITALS — BP 127/84 | HR 69 | Temp 97.7°F | Resp 18 | Ht 73.0 in | Wt 210.5 lb

## 2019-08-29 DIAGNOSIS — C187 Malignant neoplasm of sigmoid colon: Secondary | ICD-10-CM

## 2019-08-29 DIAGNOSIS — Z8041 Family history of malignant neoplasm of ovary: Secondary | ICD-10-CM | POA: Diagnosis not present

## 2019-08-29 DIAGNOSIS — Z9221 Personal history of antineoplastic chemotherapy: Secondary | ICD-10-CM | POA: Diagnosis not present

## 2019-08-29 DIAGNOSIS — Z800 Family history of malignant neoplasm of digestive organs: Secondary | ICD-10-CM | POA: Insufficient documentation

## 2019-08-29 DIAGNOSIS — Z803 Family history of malignant neoplasm of breast: Secondary | ICD-10-CM | POA: Diagnosis not present

## 2019-08-29 LAB — CBC WITH DIFFERENTIAL (CANCER CENTER ONLY)
Abs Immature Granulocytes: 0.03 10*3/uL (ref 0.00–0.07)
Basophils Absolute: 0.1 10*3/uL (ref 0.0–0.1)
Basophils Relative: 1 %
Eosinophils Absolute: 0.4 10*3/uL (ref 0.0–0.5)
Eosinophils Relative: 6 %
HCT: 46.6 % (ref 39.0–52.0)
Hemoglobin: 15.9 g/dL (ref 13.0–17.0)
Immature Granulocytes: 1 %
Lymphocytes Relative: 41 %
Lymphs Abs: 2.6 10*3/uL (ref 0.7–4.0)
MCH: 32.6 pg (ref 26.0–34.0)
MCHC: 34.1 g/dL (ref 30.0–36.0)
MCV: 95.5 fL (ref 80.0–100.0)
Monocytes Absolute: 0.6 10*3/uL (ref 0.1–1.0)
Monocytes Relative: 9 %
Neutro Abs: 2.7 10*3/uL (ref 1.7–7.7)
Neutrophils Relative %: 42 %
Platelet Count: 315 10*3/uL (ref 150–400)
RBC: 4.88 MIL/uL (ref 4.22–5.81)
RDW: 13.4 % (ref 11.5–15.5)
WBC Count: 6.3 10*3/uL (ref 4.0–10.5)
nRBC: 0 % (ref 0.0–0.2)

## 2019-08-29 LAB — CMP (CANCER CENTER ONLY)
ALT: 24 U/L (ref 0–44)
AST: 26 U/L (ref 15–41)
Albumin: 4.2 g/dL (ref 3.5–5.0)
Alkaline Phosphatase: 74 U/L (ref 38–126)
Anion gap: 8 (ref 5–15)
BUN: 8 mg/dL (ref 6–20)
CO2: 26 mmol/L (ref 22–32)
Calcium: 9.2 mg/dL (ref 8.9–10.3)
Chloride: 103 mmol/L (ref 98–111)
Creatinine: 1.04 mg/dL (ref 0.61–1.24)
GFR, Est AFR Am: >60 mL/min (ref >60)
GFR, Estimated: >60 mL/min (ref >60)
Glucose, Bld: 84 mg/dL (ref 70–99)
Potassium: 4.3 mmol/L (ref 3.5–5.1)
Sodium: 137 mmol/L (ref 135–145)
Total Bilirubin: 0.4 mg/dL (ref 0.3–1.2)
Total Protein: 7.6 g/dL (ref 6.5–8.1)

## 2019-08-29 LAB — CEA (IN HOUSE-CHCC): CEA (CHCC-In House): 1.32 ng/mL (ref 0.00–5.00)

## 2019-08-29 NOTE — Progress Notes (Signed)
  [place] OFFICE PROGRESS NOTE   Diagnosis: Colon cancer  INTERVAL HISTORY:   Brian Avery returns as scheduled.  He completed cycle 1 adjuvant Xeloda beginning 08/13/2019.  He denies nausea/vomiting.  No mouth sores.  No diarrhea.  No hand or foot pain or redness.  Some abdominal cramps.  He has noted increased thirst/dry mouth for about the past week.  Urination seems normal.  No history of diabetes.  He notes fatigue.  Alteration in taste.  Objective:  Vital signs in last 24 hours:  Blood pressure 127/84, pulse 69, temperature 97.7 F (36.5 C), temperature source Temporal, resp. rate 18, height 6\' 1"  (1.854 m), weight 210 lb 8 oz (95.5 kg), SpO2 100 %.    HEENT: No thrush or ulcers.  Mucous membranes appear moist. Resp: Lungs clear bilaterally. Cardio: Regular rate and rhythm. GI: Abdomen soft.  No hepatosplenomegaly. Vascular: No leg edema. Neuro: Alert and oriented. Skin: Palms with mild erythema.  Skin turgor intact.   Lab Results:  Lab Results  Component Value Date   WBC 6.3 08/29/2019   HGB 15.9 08/29/2019   HCT 46.6 08/29/2019   MCV 95.5 08/29/2019   PLT 315 08/29/2019   NEUTROABS 2.7 08/29/2019    Imaging:  No results found.  Medications: I have reviewed the patient's current medications.  Assessment/Plan: 1. Sigmoid colon cancer, stage II (T3N0) ? Colonoscopy 06/04/2019-cecum polyp, mass in the sigmoid colon beginning at 28 cm, biopsy confirmed a sessile serrated adenoma of the cecum, adenocarcinoma at the sigmoid colon ? CTs 06/05/2019-eccentric wall thickening of the proximal sigmoid colon with subcentimeter lymph nodes in the adjacent mesocolon, prominent nonspecific retroperitoneal nodes-largest 1 x 1 cm ? Robotic assisted sigmoidectomy 07/23/2019, well differentiated adenocarcinoma of the proximal sigmoid colon, tumor extends through the muscularis propria, positive lymphovascular invasion, negative resection margins, no loss of mismatch  repair protein expression ? Cycle 1 adjuvant Xeloda 08/13/2019 ? Cycle 2 adjuvant Xeloda 09/03/2019 2. Remote history of sarcoidosis-pulmonary and skin, he reports treatment with prednisone with resolution 3. Family history of multiple cancers including breast, ovarian, and a "gastrointestinal "malignancy  Disposition: Brian Avery appears stable. He has completed 1 cycle of Xeloda.  He tolerated without significant acute toxicity.  Plan to proceed with cycle 2 as scheduled beginning 09/03/2019.    We reviewed the CBC and chemistry panel from today.  Labs adequate to proceed as above.  He notes recent increased thirst/dry mouth, etiology unclear.  Glucose, electrolytes, kidney function all in normal range on lab work today.  He will contact the office if this persists.  He will try to maintain adequate hydration.  He will return for lab, follow-up in 3 weeks.  He will contact the office in the interim as outlined above or with any other problems.  Plan reviewed with Dr. Benay Spice.      Ned Card ANP/GNP-BC   08/29/2019  11:08 AM

## 2019-08-29 NOTE — Telephone Encounter (Signed)
Scheduled per 07/14 los, patient will be notified per My chart. 

## 2019-08-31 MED FILL — CAPECITABINE 500 MG TABLET: 500 | 21 days supply | Qty: 112 | Fill #0

## 2019-09-15 ENCOUNTER — Telehealth: Payer: Self-pay | Admitting: Hematology

## 2019-09-15 NOTE — Telephone Encounter (Signed)
Pt called answering service. He is on second cycle Xeloda, day 13 today. He noticed skin erythema on his palms and feet yesterday, and noticed diffuse hives on his arms and lower extremity with itchiness.  No lip swelling, shortness of breath, fever, or other new symptoms.  Only other new medication or supplement with vitamin D which she started yesterday.  He took Benadryl before he called.  Not sure if his allergy today is related to Xeloda, but I recommend him to hold Xeloda tonight and tomorrow, and call us back on Monday morning. He knows to call us back or go to emergency room if he has worsening allergy symptoms.  Truitt Merle  09/15/2019 9:11 AM

## 2019-09-17 ENCOUNTER — Telehealth: Payer: Self-pay | Admitting: *Deleted

## 2019-09-17 NOTE — Telephone Encounter (Addendum)
Called patient to offer appointment today to be seen for his red/tender feet and rash. He reports doing much better now after holding the Xeloda x 2 days and taking Benadryl. Rash is gone as well. He plans to finish his last day of treatment today if MD agrees. Declines to come today-prefers to wait till Wednesday. Per Dr. Benay Spice: OK to take Xeloda today.

## 2019-09-19 ENCOUNTER — Other Ambulatory Visit: Payer: 59

## 2019-09-19 ENCOUNTER — Other Ambulatory Visit: Payer: Self-pay

## 2019-09-19 ENCOUNTER — Inpatient Hospital Stay: Payer: 59 | Attending: Oncology | Admitting: Oncology

## 2019-09-19 ENCOUNTER — Inpatient Hospital Stay: Payer: 59

## 2019-09-19 VITALS — BP 135/89 | HR 74 | Temp 97.6°F | Resp 18 | Ht 73.0 in | Wt 212.1 lb

## 2019-09-19 DIAGNOSIS — C187 Malignant neoplasm of sigmoid colon: Secondary | ICD-10-CM

## 2019-09-19 DIAGNOSIS — D869 Sarcoidosis, unspecified: Secondary | ICD-10-CM | POA: Insufficient documentation

## 2019-09-19 DIAGNOSIS — R590 Localized enlarged lymph nodes: Secondary | ICD-10-CM | POA: Insufficient documentation

## 2019-09-19 DIAGNOSIS — L509 Urticaria, unspecified: Secondary | ICD-10-CM | POA: Diagnosis not present

## 2019-09-19 DIAGNOSIS — Z800 Family history of malignant neoplasm of digestive organs: Secondary | ICD-10-CM | POA: Insufficient documentation

## 2019-09-19 DIAGNOSIS — M79673 Pain in unspecified foot: Secondary | ICD-10-CM | POA: Diagnosis not present

## 2019-09-19 DIAGNOSIS — F329 Major depressive disorder, single episode, unspecified: Secondary | ICD-10-CM | POA: Insufficient documentation

## 2019-09-19 DIAGNOSIS — Z86010 Personal history of colonic polyps: Secondary | ICD-10-CM | POA: Insufficient documentation

## 2019-09-19 DIAGNOSIS — Z8041 Family history of malignant neoplasm of ovary: Secondary | ICD-10-CM | POA: Insufficient documentation

## 2019-09-19 DIAGNOSIS — Z803 Family history of malignant neoplasm of breast: Secondary | ICD-10-CM | POA: Insufficient documentation

## 2019-09-19 LAB — CMP (CANCER CENTER ONLY)
ALT: 27 U/L (ref 0–44)
AST: 29 U/L (ref 15–41)
Albumin: 4.3 g/dL (ref 3.5–5.0)
Alkaline Phosphatase: 61 U/L (ref 38–126)
Anion gap: 8 (ref 5–15)
BUN: 13 mg/dL (ref 6–20)
CO2: 28 mmol/L (ref 22–32)
Calcium: 9.5 mg/dL (ref 8.9–10.3)
Chloride: 102 mmol/L (ref 98–111)
Creatinine: 1.13 mg/dL (ref 0.61–1.24)
GFR, Est AFR Am: >60 mL/min (ref >60)
GFR, Estimated: >60 mL/min (ref >60)
Glucose, Bld: 108 mg/dL — ABNORMAL HIGH (ref 70–99)
Potassium: 4.1 mmol/L (ref 3.5–5.1)
Sodium: 138 mmol/L (ref 135–145)
Total Bilirubin: 1.2 mg/dL (ref 0.3–1.2)
Total Protein: 7.1 g/dL (ref 6.5–8.1)

## 2019-09-19 LAB — CBC WITH DIFFERENTIAL (CANCER CENTER ONLY)
Abs Immature Granulocytes: 0.03 10*3/uL (ref 0.00–0.07)
Basophils Absolute: 0.1 10*3/uL (ref 0.0–0.1)
Basophils Relative: 1 %
Eosinophils Absolute: 0.5 10*3/uL (ref 0.0–0.5)
Eosinophils Relative: 8 %
HCT: 42.6 % (ref 39.0–52.0)
Hemoglobin: 14.8 g/dL (ref 13.0–17.0)
Immature Granulocytes: 1 %
Lymphocytes Relative: 39 %
Lymphs Abs: 2.4 10*3/uL (ref 0.7–4.0)
MCH: 33.6 pg (ref 26.0–34.0)
MCHC: 34.7 g/dL (ref 30.0–36.0)
MCV: 96.6 fL (ref 80.0–100.0)
Monocytes Absolute: 0.5 10*3/uL (ref 0.1–1.0)
Monocytes Relative: 9 %
Neutro Abs: 2.6 10*3/uL (ref 1.7–7.7)
Neutrophils Relative %: 42 %
Platelet Count: 268 10*3/uL (ref 150–400)
RBC: 4.41 MIL/uL (ref 4.22–5.81)
RDW: 16 % — ABNORMAL HIGH (ref 11.5–15.5)
WBC Count: 6.2 10*3/uL (ref 4.0–10.5)
nRBC: 0 % (ref 0.0–0.2)

## 2019-09-19 MED ORDER — CAPECITABINE 500 MG PO TABS: ORAL_TABLET | ORAL | 0 refills | Status: DC

## 2019-09-19 MED FILL — CAPECITABINE 500 MG TABLET: 500 | 21 days supply | Qty: 84 | Fill #0

## 2019-09-19 NOTE — Progress Notes (Signed)
[place] OFFICE PROGRESS NOTE   Diagnosis: Colon cancer  INTERVAL HISTORY:   Mr. Vallier returns for a scheduled visit.  He began cycle 2 capecitabine on 09/03/2019.  On 09/14/2019 he noted the onset of pain in the feet.  He worked in the yard on 09/14/2019.  He noted erythema and discomfort of the feet.  He also had erythema of the palms.  He did not take capecitabine on 09/15/2019.  On 09/15/2019 he noted "hives "over the trunk and arms.  The hives resolved over hours after he took a dose of Benadryl.  No associated symptoms.  The hives were pruritic.  He used a new sunscreen on 09/14/2019.  The hands and feet had improved on 09/16/2019 and he resumed Xeloda for 2 more days.  The hands and feet are now better.  No recurrent hives.  He reports sensitivity in the mouth when he eats spicy or hot food.  Irregular bowel habits, but no frank diarrhea.  He has some depression symptoms.  Objective:  Vital signs in last 24 hours:  Blood pressure 135/89, pulse 74, temperature 97.6 F (36.4 C), temperature source Temporal, resp. rate 18, height _0  (1.854 m), weight 212 lb 1.6 oz (96.2 kg), SpO2 100 %.    HEENT: No thrush or ulcers Resp: Lungs clear bilaterally Cardio: Regular rate and rhythm GI: No hepatomegaly, nontender Vascular: No leg edema  Skin: No rash or hives over the trunk or extremities.  Few tiny pustular lesions over the abdominal wall.  Mild erythema and dryness of the palms and soles.  No blisters.   Lab Results:  Lab Results  Component Value Date   WBC 6.2 09/19/2019   HGB 14.8 09/19/2019   HCT 42.6 09/19/2019   MCV 96.6 09/19/2019   PLT 268 09/19/2019   NEUTROABS 2.6 09/19/2019    CMP  Lab Results  Component Value Date   NA 137 08/29/2019   K 4.3 08/29/2019   CL 103 08/29/2019   CO2 26 08/29/2019   GLUCOSE 84 08/29/2019   BUN 8 08/29/2019   CREATININE 1.04 08/29/2019   CALCIUM 9.2 08/29/2019   PROT 7.6 08/29/2019   ALBUMIN 4.2 08/29/2019    AST 26 08/29/2019   ALT 24 08/29/2019   ALKPHOS 74 08/29/2019   BILITOT 0.4 08/29/2019   GFRNONAA >60 08/29/2019   GFRAA >60 08/29/2019    Lab Results  Component Value Date   CEA1 1.32 08/29/2019     Medications: I have reviewed the patient's current medications.   Assessment/Plan: 1. Sigmoid colon cancer, stage II (T3N0) ? Colonoscopy 06/04/2019-cecum polyp, mass in the sigmoid colon beginning at 28 cm, biopsy confirmed a sessile serrated adenoma of the cecum, adenocarcinoma at the sigmoid colon ? CTs 06/05/2019-eccentric wall thickening of the proximal sigmoid colon with subcentimeter lymph nodes in the adjacent mesocolon, prominent nonspecific retroperitoneal nodes-largest 1 x 1 cm ? Robotic assisted sigmoidectomy 07/23/2019, well differentiated adenocarcinoma of the proximal sigmoid colon, tumor extends through the muscularis propria, positive lymphovascular invasion, negative resection margins, no loss of mismatch repair protein expression ? Cycle 1 adjuvant Xeloda 08/13/2019 ? Cycle 2 adjuvant Xeloda 09/03/2019, held on 09/15/2019 secondary to hand/foot pain and hives, resumed 09/16/2019 ? Cycle 3 adjuvant Xeloda 09/24/2019, dose reduction to 1500 mg twice daily secondary to hand/foot syndrome 2. Remote history of sarcoidosis-pulmonary and skin, he reports treatment with prednisone with resolution 3. Family history of multiple cancers including breast, ovarian, and a "gastrointestinal "malignancy   Disposition: Mr. Doyle  has completed 2 cycles of adjuvant Xeloda.  He developed foot pain last week and continues to have evidence of mild hand/foot syndrome.  He reports the foot pain was debilitating on 09/14/2019 and 09/15/2019.  The plan is to continue Xeloda with cycle 3 beginning 09/24/2019.  The Xeloda will be dose reduced.  The etiology of the "hives "he experienced on 09/15/2019 is unclear.  This may be related to the sunscreen he used on 09/14/2019, sun exposure, or capecitabine.  He will  call for recurrent hives.  Mr. Davidow reports mild depression symptoms.  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  He does not wish to see the psychologist or begin an antidepressant.  He declines the COVID-19 vaccine for now.  He will call for hand/foot pain or new symptoms.  Mr. Morey will return for an office visit in 3 weeks.  Betsy Coder, MD  09/19/2019  9:11 AM

## 2019-09-20 ENCOUNTER — Telehealth: Payer: Self-pay | Admitting: Oncology

## 2019-09-20 NOTE — Telephone Encounter (Signed)
Scheduled per 8/4 los. Unable to reach pt. Left voicemail with appt time and date.

## 2019-10-10 ENCOUNTER — Inpatient Hospital Stay: Payer: 59

## 2019-10-10 ENCOUNTER — Encounter: Payer: Self-pay | Admitting: Nurse Practitioner

## 2019-10-10 ENCOUNTER — Inpatient Hospital Stay (HOSPITAL_BASED_OUTPATIENT_CLINIC_OR_DEPARTMENT_OTHER): Payer: 59 | Admitting: Nurse Practitioner

## 2019-10-10 ENCOUNTER — Other Ambulatory Visit: Payer: Self-pay

## 2019-10-10 VITALS — BP 127/84 | HR 73 | Temp 97.9°F | Resp 18 | Ht 73.0 in | Wt 213.8 lb

## 2019-10-10 DIAGNOSIS — C187 Malignant neoplasm of sigmoid colon: Secondary | ICD-10-CM

## 2019-10-10 DIAGNOSIS — M79673 Pain in unspecified foot: Secondary | ICD-10-CM | POA: Diagnosis not present

## 2019-10-10 DIAGNOSIS — R590 Localized enlarged lymph nodes: Secondary | ICD-10-CM | POA: Diagnosis not present

## 2019-10-10 DIAGNOSIS — Z86010 Personal history of colonic polyps: Secondary | ICD-10-CM | POA: Diagnosis not present

## 2019-10-10 DIAGNOSIS — D869 Sarcoidosis, unspecified: Secondary | ICD-10-CM | POA: Diagnosis not present

## 2019-10-10 DIAGNOSIS — Z803 Family history of malignant neoplasm of breast: Secondary | ICD-10-CM | POA: Diagnosis not present

## 2019-10-10 DIAGNOSIS — L509 Urticaria, unspecified: Secondary | ICD-10-CM | POA: Diagnosis not present

## 2019-10-10 DIAGNOSIS — Z800 Family history of malignant neoplasm of digestive organs: Secondary | ICD-10-CM | POA: Diagnosis not present

## 2019-10-10 DIAGNOSIS — F329 Major depressive disorder, single episode, unspecified: Secondary | ICD-10-CM | POA: Diagnosis not present

## 2019-10-10 LAB — CMP (CANCER CENTER ONLY)
ALT: 23 U/L (ref 0–44)
AST: 24 U/L (ref 15–41)
Albumin: 4.2 g/dL (ref 3.5–5.0)
Alkaline Phosphatase: 53 U/L (ref 38–126)
Anion gap: 6 (ref 5–15)
BUN: 10 mg/dL (ref 6–20)
CO2: 28 mmol/L (ref 22–32)
Calcium: 9.6 mg/dL (ref 8.9–10.3)
Chloride: 104 mmol/L (ref 98–111)
Creatinine: 0.93 mg/dL (ref 0.61–1.24)
GFR, Est AFR Am: >60 mL/min (ref >60)
GFR, Estimated: >60 mL/min (ref >60)
Glucose, Bld: 89 mg/dL (ref 70–99)
Potassium: 3.8 mmol/L (ref 3.5–5.1)
Sodium: 138 mmol/L (ref 135–145)
Total Bilirubin: 1 mg/dL (ref 0.3–1.2)
Total Protein: 6.8 g/dL (ref 6.5–8.1)

## 2019-10-10 LAB — CBC WITH DIFFERENTIAL (CANCER CENTER ONLY)
Abs Immature Granulocytes: 0.02 10*3/uL (ref 0.00–0.07)
Basophils Absolute: 0.1 10*3/uL (ref 0.0–0.1)
Basophils Relative: 1 %
Eosinophils Absolute: 0.4 10*3/uL (ref 0.0–0.5)
Eosinophils Relative: 7 %
HCT: 40.5 % (ref 39.0–52.0)
Hemoglobin: 14.2 g/dL (ref 13.0–17.0)
Immature Granulocytes: 0 %
Lymphocytes Relative: 41 %
Lymphs Abs: 2.5 10*3/uL (ref 0.7–4.0)
MCH: 34.5 pg — ABNORMAL HIGH (ref 26.0–34.0)
MCHC: 35.1 g/dL (ref 30.0–36.0)
MCV: 98.5 fL (ref 80.0–100.0)
Monocytes Absolute: 0.6 10*3/uL (ref 0.1–1.0)
Monocytes Relative: 10 %
Neutro Abs: 2.5 10*3/uL (ref 1.7–7.7)
Neutrophils Relative %: 41 %
Platelet Count: 231 10*3/uL (ref 150–400)
RBC: 4.11 MIL/uL — ABNORMAL LOW (ref 4.22–5.81)
RDW: 18.4 % — ABNORMAL HIGH (ref 11.5–15.5)
WBC Count: 6.1 10*3/uL (ref 4.0–10.5)
nRBC: 0 % (ref 0.0–0.2)

## 2019-10-10 MED ORDER — CAPECITABINE 500 MG PO TABS: ORAL_TABLET | ORAL | 0 refills | Status: DC

## 2019-10-10 NOTE — Progress Notes (Signed)
  [place] OFFICE PROGRESS NOTE   Diagnosis: Colon cancer  INTERVAL HISTORY:   Mr. Goodlin returns as scheduled.  He completed cycle 3 adjuvant Xeloda beginning 10/04/2019.  Xeloda was dose reduced due to hand-foot syndrome following cycle 2.  He reports tolerating cycle 3 well.  No nausea or vomiting.  No mouth sores.  No diarrhea.  He did have some abdominal cramping around day 10.  No hand or foot pain or redness.  Objective:  Vital signs in last 24 hours:  Blood pressure 127/84, pulse 73, temperature 97.9 F (36.6 C), temperature source Axillary, resp. rate 18, height _0  (1.854 m), weight 213 lb 12.8 oz (97 kg), SpO2 99 %.    HEENT: No thrush or ulcers. Resp: Lungs clear bilaterally. Cardio: Regular rate and rhythm. GI: Abdomen soft and nontender.  No hepatomegaly. Vascular: No leg edema.  Skin: Palms with mild erythema, dryness.   Lab Results:  Lab Results  Component Value Date   WBC 6.1 10/10/2019   HGB 14.2 10/10/2019   HCT 40.5 10/10/2019   MCV 98.5 10/10/2019   PLT 231 10/10/2019   NEUTROABS 2.5 10/10/2019    Imaging:  No results found.  Medications: I have reviewed the patient's current medications.  Assessment/Plan: 1. Sigmoid colon cancer, stage II (T3N0) ? Colonoscopy 06/04/2019-cecum polyp, mass in the sigmoid colon beginning at 28 cm, biopsy confirmed a sessile serrated adenoma of the cecum, adenocarcinoma at the sigmoid colon ? CTs 06/05/2019-eccentric wall thickening of the proximal sigmoid colon with subcentimeter lymph nodes in the adjacent mesocolon, prominent nonspecific retroperitoneal nodes-largest 1 x 1 cm ? Robotic assisted sigmoidectomy 07/23/2019, well differentiated adenocarcinoma of the proximal sigmoid colon, tumor extends through the muscularis propria, positive lymphovascular invasion, negative resection margins, no loss of mismatch repair protein expression ? Cycle 1 adjuvant Xeloda 08/13/2019 ? Cycle 2 adjuvant Xeloda  09/03/2019, held on 09/15/2019 secondary to hand/foot pain and hives, resumed 09/16/2019 ? Cycle 3 adjuvant Xeloda 09/24/2019, dose reduction to 1500 mg twice daily secondary to hand/foot syndrome ? Cycle 4 adjuvant Xeloda 10/15/2019, 1500 mg twice daily 2. Remote history of sarcoidosis-pulmonary and skin, he reports treatment with prednisone with resolution 3. Family history of multiple cancers including breast, ovarian, and a "gastrointestinal" malignancy  Disposition: Mr. Bona appears stable.  He tolerated cycle 3 Xeloda well.  Plan to proceed with cycle 4 as scheduled beginning 10/15/2019, same dose as with cycle 3.  He understands to contact the office with progressive hand/foot symptoms, diarrhea, mouth sores.  We reviewed the CBC from today.  Counts adequate to proceed as above.  He will return for lab and follow-up in 3 weeks.  He will contact the office in the interim as outlined above or with any other problems.    Ned Card ANP/GNP-BC   10/10/2019  9:32 AM

## 2019-10-10 NOTE — Progress Notes (Signed)
xeloda refilled sent to [facility]

## 2019-10-11 ENCOUNTER — Telehealth: Payer: Self-pay | Admitting: Oncology

## 2019-10-11 MED FILL — CAPECITABINE 500 MG TABLET: 500 | 21 days supply | Qty: 84 | Fill #0

## 2019-10-11 NOTE — Telephone Encounter (Signed)
Scheduled appointments per 8/25 los. Patient is aware of appointment date and time.

## 2019-10-23 DIAGNOSIS — F4323 Adjustment disorder with mixed anxiety and depressed mood: Secondary | ICD-10-CM | POA: Diagnosis not present

## 2019-10-31 ENCOUNTER — Inpatient Hospital Stay (HOSPITAL_BASED_OUTPATIENT_CLINIC_OR_DEPARTMENT_OTHER): Payer: 59 | Admitting: Oncology

## 2019-10-31 ENCOUNTER — Other Ambulatory Visit: Payer: Self-pay

## 2019-10-31 ENCOUNTER — Inpatient Hospital Stay: Payer: 59 | Attending: Oncology

## 2019-10-31 VITALS — BP 131/88 | HR 72 | Temp 97.4°F | Resp 18 | Ht 73.0 in | Wt 214.1 lb

## 2019-10-31 DIAGNOSIS — C187 Malignant neoplasm of sigmoid colon: Secondary | ICD-10-CM | POA: Diagnosis not present

## 2019-10-31 LAB — CBC WITH DIFFERENTIAL (CANCER CENTER ONLY)
Abs Immature Granulocytes: 0.02 10*3/uL (ref 0.00–0.07)
Basophils Absolute: 0.1 10*3/uL (ref 0.0–0.1)
Basophils Relative: 1 %
Eosinophils Absolute: 0.3 10*3/uL (ref 0.0–0.5)
Eosinophils Relative: 6 %
HCT: 42.1 % (ref 39.0–52.0)
Hemoglobin: 14.8 g/dL (ref 13.0–17.0)
Immature Granulocytes: 0 %
Lymphocytes Relative: 41 %
Lymphs Abs: 2.4 10*3/uL (ref 0.7–4.0)
MCH: 35.7 pg — ABNORMAL HIGH (ref 26.0–34.0)
MCHC: 35.2 g/dL (ref 30.0–36.0)
MCV: 101.7 fL — ABNORMAL HIGH (ref 80.0–100.0)
Monocytes Absolute: 0.6 10*3/uL (ref 0.1–1.0)
Monocytes Relative: 11 %
Neutro Abs: 2.4 10*3/uL (ref 1.7–7.7)
Neutrophils Relative %: 41 %
Platelet Count: 250 10*3/uL (ref 150–400)
RBC: 4.14 MIL/uL — ABNORMAL LOW (ref 4.22–5.81)
RDW: 18.8 % — ABNORMAL HIGH (ref 11.5–15.5)
WBC Count: 5.9 10*3/uL (ref 4.0–10.5)
nRBC: 0 % (ref 0.0–0.2)

## 2019-10-31 LAB — CMP (CANCER CENTER ONLY)
ALT: 24 U/L (ref 0–44)
AST: 25 U/L (ref 15–41)
Albumin: 4.3 g/dL (ref 3.5–5.0)
Alkaline Phosphatase: 57 U/L (ref 38–126)
Anion gap: 5 (ref 5–15)
BUN: 11 mg/dL (ref 6–20)
CO2: 29 mmol/L (ref 22–32)
Calcium: 9.3 mg/dL (ref 8.9–10.3)
Chloride: 103 mmol/L (ref 98–111)
Creatinine: 0.91 mg/dL (ref 0.61–1.24)
GFR, Est AFR Am: >60 mL/min (ref >60)
GFR, Estimated: >60 mL/min (ref >60)
Glucose, Bld: 95 mg/dL (ref 70–99)
Potassium: 4.1 mmol/L (ref 3.5–5.1)
Sodium: 137 mmol/L (ref 135–145)
Total Bilirubin: 0.8 mg/dL (ref 0.3–1.2)
Total Protein: 7.1 g/dL (ref 6.5–8.1)

## 2019-10-31 NOTE — Progress Notes (Signed)
  [facility] OFFICE PROGRESS NOTE   Diagnosis: Colon cancer  INTERVAL HISTORY:    Brian Avery returns as scheduled.  He completed 9 cycles letter beginning 10/15/2019.  He had mild diarrhea during the last few days of Xeloda therapy and felt rectal urgency.  The symptoms have resolved.  No recurrent hives or rash.  No hand or foot pain.  Objective:  Vital signs in last 24 hours:  Blood pressure 131/88, pulse 72, temperature (!) 97.4 F (36.3 C), temperature source Tympanic, resp. rate 18, height _0  (1.854 m), weight 214 lb 1.6 oz (97.1 kg), SpO2 99 %.    HEENT: No thrush or ulcers Resp: Lungs clear bilaterally Cardio: Regular rate and rhythm GI: No hepatosplenomegaly, nontender Vascular: No leg edema  Skin: Mild dryness of the palms, no erythema    Lab Results:  Lab Results  Component Value Date   WBC 5.9 10/31/2019   HGB 14.8 10/31/2019   HCT 42.1 10/31/2019   MCV 101.7 (H) 10/31/2019   PLT 250 10/31/2019   NEUTROABS 2.4 10/31/2019    CMP  Lab Results  Component Value Date   NA 137 10/31/2019   K 4.1 10/31/2019   CL 103 10/31/2019   CO2 29 10/31/2019   GLUCOSE 95 10/31/2019   BUN 11 10/31/2019   CREATININE 0.91 10/31/2019   CALCIUM 9.3 10/31/2019   PROT 7.1 10/31/2019   ALBUMIN 4.3 10/31/2019   AST 25 10/31/2019   ALT 24 10/31/2019   ALKPHOS 57 10/31/2019   BILITOT 0.8 10/31/2019   GFRNONAA >60 10/31/2019   GFRAA >60 10/31/2019    Lab Results  Component Value Date   CEA1 1.32 08/29/2019    Medications: I have reviewed the patient's current medications.   Assessment/Plan: 1. Sigmoid colon cancer, stage II (T3N0) ? Colonoscopy 06/04/2019-cecum polyp, mass in the sigmoid colon beginning at 28 cm, biopsy confirmed a sessile serrated adenoma of the cecum, adenocarcinoma at the sigmoid colon ? CTs 06/05/2019-eccentric wall thickening of the proximal sigmoid colon with subcentimeter lymph nodes in the adjacent mesocolon, prominent  nonspecific retroperitoneal nodes-largest 1 x 1 cm ? Robotic assisted sigmoidectomy 07/23/2019, well differentiated adenocarcinoma of the proximal sigmoid colon, tumor extends through the muscularis propria, positive lymphovascular invasion, negative resection margins, no loss of mismatch repair protein expression ? Cycle 1 adjuvant Xeloda 08/13/2019 ? Cycle 2 adjuvant Xeloda 09/03/2019, held on 09/15/2019 secondary to hand/foot pain and hives, resumed 09/16/2019 ? Cycle 3 adjuvant Xeloda 09/24/2019, dose reduction to 1500 mg twice daily secondary to hand/foot syndrome ? Cycle 4 adjuvant Xeloda 10/15/2019, 1500 mg twice daily ? Cycle 5 adjuvant Xeloda 11/05/2019, 1500 mg twice daily 2. Remote history of sarcoidosis-pulmonary and skin, he reports treatment with prednisone with resolution 3. Family history of multiple cancers including breast, ovarian, and a "gastrointestinal" malignancy    Disposition: Brian Avery appears well.  He has completed 4 cycles of adjuvant Xeloda.  He will continue Xeloda at the current dose.  We will increase the Xeloda dose with cycle 6 if he tolerates this cycle without significant hand/foot symptoms.  Betsy Coder, MD  10/31/2019  10:40 AM

## 2019-11-01 ENCOUNTER — Telehealth: Payer: Self-pay | Admitting: Oncology

## 2019-11-01 ENCOUNTER — Other Ambulatory Visit: Payer: Self-pay | Admitting: Oncology

## 2019-11-01 DIAGNOSIS — F4323 Adjustment disorder with mixed anxiety and depressed mood: Secondary | ICD-10-CM | POA: Diagnosis not present

## 2019-11-01 DIAGNOSIS — C187 Malignant neoplasm of sigmoid colon: Secondary | ICD-10-CM

## 2019-11-01 NOTE — Telephone Encounter (Signed)
Scheduled appointment per 9/15 los. Patient is aware of upcoming appointment date and time.

## 2019-11-02 MED FILL — CAPECITABINE 500 MG TABLET: 500 | 21 days supply | Qty: 84 | Fill #0

## 2019-11-12 DIAGNOSIS — F4323 Adjustment disorder with mixed anxiety and depressed mood: Secondary | ICD-10-CM | POA: Diagnosis not present

## 2019-11-21 ENCOUNTER — Inpatient Hospital Stay (HOSPITAL_BASED_OUTPATIENT_CLINIC_OR_DEPARTMENT_OTHER): Payer: 59 | Admitting: Nurse Practitioner

## 2019-11-21 ENCOUNTER — Other Ambulatory Visit: Payer: Self-pay

## 2019-11-21 ENCOUNTER — Inpatient Hospital Stay: Payer: 59 | Attending: Oncology

## 2019-11-21 VITALS — BP 114/75 | HR 70 | Temp 97.2°F | Resp 16 | Ht 73.0 in | Wt 212.9 lb

## 2019-11-21 DIAGNOSIS — Z800 Family history of malignant neoplasm of digestive organs: Secondary | ICD-10-CM | POA: Insufficient documentation

## 2019-11-21 DIAGNOSIS — Z9221 Personal history of antineoplastic chemotherapy: Secondary | ICD-10-CM | POA: Insufficient documentation

## 2019-11-21 DIAGNOSIS — C187 Malignant neoplasm of sigmoid colon: Secondary | ICD-10-CM

## 2019-11-21 DIAGNOSIS — Z803 Family history of malignant neoplasm of breast: Secondary | ICD-10-CM | POA: Insufficient documentation

## 2019-11-21 DIAGNOSIS — Z8041 Family history of malignant neoplasm of ovary: Secondary | ICD-10-CM | POA: Diagnosis not present

## 2019-11-21 LAB — CMP (CANCER CENTER ONLY)
ALT: 21 U/L (ref 0–44)
AST: 25 U/L (ref 15–41)
Albumin: 4.1 g/dL (ref 3.5–5.0)
Alkaline Phosphatase: 47 U/L (ref 38–126)
Anion gap: 4 — ABNORMAL LOW (ref 5–15)
BUN: 7 mg/dL (ref 6–20)
CO2: 31 mmol/L (ref 22–32)
Calcium: 9.2 mg/dL (ref 8.9–10.3)
Chloride: 102 mmol/L (ref 98–111)
Creatinine: 0.92 mg/dL (ref 0.61–1.24)
GFR, Estimated: >60 mL/min (ref >60)
Glucose, Bld: 97 mg/dL (ref 70–99)
Potassium: 4.4 mmol/L (ref 3.5–5.1)
Sodium: 137 mmol/L (ref 135–145)
Total Bilirubin: 0.7 mg/dL (ref 0.3–1.2)
Total Protein: 6.8 g/dL (ref 6.5–8.1)

## 2019-11-21 LAB — CBC WITH DIFFERENTIAL (CANCER CENTER ONLY)
Abs Immature Granulocytes: 0.01 10*3/uL (ref 0.00–0.07)
Basophils Absolute: 0.1 10*3/uL (ref 0.0–0.1)
Basophils Relative: 1 %
Eosinophils Absolute: 0.2 10*3/uL (ref 0.0–0.5)
Eosinophils Relative: 4 %
HCT: 40.5 % (ref 39.0–52.0)
Hemoglobin: 14.3 g/dL (ref 13.0–17.0)
Immature Granulocytes: 0 %
Lymphocytes Relative: 33 %
Lymphs Abs: 1.6 10*3/uL (ref 0.7–4.0)
MCH: 37.5 pg — ABNORMAL HIGH (ref 26.0–34.0)
MCHC: 35.3 g/dL (ref 30.0–36.0)
MCV: 106.3 fL — ABNORMAL HIGH (ref 80.0–100.0)
Monocytes Absolute: 0.6 10*3/uL (ref 0.1–1.0)
Monocytes Relative: 13 %
Neutro Abs: 2.4 10*3/uL (ref 1.7–7.7)
Neutrophils Relative %: 49 %
Platelet Count: 236 10*3/uL (ref 150–400)
RBC: 3.81 MIL/uL — ABNORMAL LOW (ref 4.22–5.81)
RDW: 17.3 % — ABNORMAL HIGH (ref 11.5–15.5)
WBC Count: 4.9 10*3/uL (ref 4.0–10.5)
nRBC: 0 % (ref 0.0–0.2)

## 2019-11-21 MED ORDER — CAPECITABINE 500 MG PO TABS: ORAL_TABLET | ORAL | 0 refills | Status: DC

## 2019-11-21 NOTE — Progress Notes (Addendum)
  [place] OFFICE PROGRESS NOTE   Diagnosis: Colon cancer  INTERVAL HISTORY:   Brian Avery returns as scheduled.  He completed 5 adjuvant Xeloda beginning 11/05/2019. He denies nausea/vomiting. No mouth sores. No diarrhea. Hands and feet remain dry, red. No significant pain   Objective:  Vital signs in last 24 hours:  Blood pressure 114/75, pulse 70, temperature (!) 97.2 F (36.2 C), temperature source Tympanic, resp. rate 16, height $RemoveBe'6\' 1"'peDgHJzCe$  (1.854 m), weight 212 lb 14.4 oz (96.6 kg), SpO2 98 %.    HEENT: No thrush or ulcers. Resp: Lungs clear bilaterally. Cardio: Regular rate and rhythm. GI: Abdomen soft and nontender. No hepatomegaly. Vascular: No leg edema. Skin: Palms and soles with mild erythema, dryness. At the base of the right great toe, plantar surface, there is a linear break in the skin.   Lab Results:  Lab Results  Component Value Date   WBC 4.9 11/21/2019   HGB 14.3 11/21/2019   HCT 40.5 11/21/2019   MCV 106.3 (H) 11/21/2019   PLT 236 11/21/2019   NEUTROABS 2.4 11/21/2019    Imaging:  No results found.  Medications: I have reviewed the patient's current medications.  Assessment/Plan: 1. Sigmoid colon cancer, stage II (T3N0) ? Colonoscopy 06/04/2019-cecum polyp, mass in the sigmoid colon beginning at 28 cm, biopsy confirmed a sessile serrated adenoma of the cecum, adenocarcinoma at the sigmoid colon ? CTs 06/05/2019-eccentric wall thickening of the proximal sigmoid colon with subcentimeter lymph nodes in the adjacent mesocolon, prominent nonspecific retroperitoneal nodes-largest 1 x 1 cm ? Robotic assisted sigmoidectomy 07/23/2019, well differentiated adenocarcinoma of the proximal sigmoid colon, tumor extends through the muscularis propria, positive lymphovascular invasion, negative resection margins, no loss of mismatch repair protein expression ? Cycle 1 adjuvant Xeloda 08/13/2019 ? Cycle 2 adjuvant Xeloda 09/03/2019, held on 09/15/2019  secondary to hand/foot pain and hives, resumed 09/16/2019 ? Cycle 3 adjuvant Xeloda 09/24/2019, dose reduction to 1500 mg twice daily secondary to hand/foot syndrome ? Cycle 4 adjuvant Xeloda 10/15/2019, 1500 mg twice daily ? Cycle 5 adjuvant Xeloda 11/05/2019, 1500 mg twice daily ? Cycle 6 adjuvant Xeloda 11/26/2019, 2000 mg every morning, 1500 mg every evening for 14 days 2. Remote history of sarcoidosis-pulmonary and skin, he reports treatment with prednisone with resolution 3. Family history of multiple cancers including breast, ovarian, and a "gastrointestinal" malignancy  Disposition: Mr. [name] appears stable. He has completed 5 cycles of adjuvant Xeloda. Plan to proceed with cycle 6 as scheduled 11/26/2019.  Symptoms of hand-foot syndrome have improved.  Xeloda dose will be increased to 2000 mg every morning and 1500 mg every evening for 14 days beginning 11/26/2019.  He understands to discontinue Xeloda and contact the office if current symptoms worsen.  He will return for lab and follow-up in 3 weeks.  We are available to see him sooner if needed.  Patient seen with Dr. Benay Spice.    Ned Card ANP/GNP-BC   11/21/2019  9:57 AM  This was a shared visit with Ned Card. BrianThissen interviewed and examined. The Xeloda dose will be reescalated with this cycle.  Julieanne Manson, MD

## 2019-11-22 ENCOUNTER — Telehealth: Payer: Self-pay | Admitting: Adult Health

## 2019-11-22 NOTE — Telephone Encounter (Signed)
Scheduled per 10/06 los, patient has been called and notified.

## 2019-11-23 ENCOUNTER — Other Ambulatory Visit: Payer: Self-pay | Admitting: Pharmacist

## 2019-11-23 DIAGNOSIS — C187 Malignant neoplasm of sigmoid colon: Secondary | ICD-10-CM

## 2019-11-23 MED ORDER — CAPECITABINE 500 MG PO TABS: ORAL_TABLET | ORAL | 0 refills | Status: DC

## 2019-11-23 MED FILL — CAPECITABINE 500 MG TABLET: 500 | 21 days supply | Qty: 98 | Fill #0

## 2019-11-23 NOTE — Progress Notes (Signed)
Oral Oncology Pharmacist Encounter  Prescription for Xeloda (capecitabine) sent to [facility]. Per NP/MD note on 11/21/19 patient increasing Xeloda dose. Prescription sent to [facility] with insufficient quantity for dose increase. Quantity now updated from 84 tablets to 98 tablets to match new instructions of "Take 2000 mg (4 tablets) by mouth in the morning. Take 1500 (3 tablets) by mouth in the evening for 14 days on, 7 days off, repeat every 21 days." Prescription redirected to [facility].  Leron Croak, PharmD, BCPS Hematology/Oncology Clinical Pharmacist [facility] [phone] 11/23/2019 11:58 AM

## 2019-12-05 DIAGNOSIS — C189 Malignant neoplasm of colon, unspecified: Secondary | ICD-10-CM | POA: Diagnosis not present

## 2019-12-06 DIAGNOSIS — F4323 Adjustment disorder with mixed anxiety and depressed mood: Secondary | ICD-10-CM | POA: Diagnosis not present

## 2019-12-10 ENCOUNTER — Other Ambulatory Visit: Payer: Self-pay | Admitting: Oncology

## 2019-12-10 DIAGNOSIS — H5213 Myopia, bilateral: Secondary | ICD-10-CM | POA: Diagnosis not present

## 2019-12-10 DIAGNOSIS — C187 Malignant neoplasm of sigmoid colon: Secondary | ICD-10-CM

## 2019-12-12 ENCOUNTER — Inpatient Hospital Stay (HOSPITAL_BASED_OUTPATIENT_CLINIC_OR_DEPARTMENT_OTHER): Payer: 59 | Admitting: Nurse Practitioner

## 2019-12-12 ENCOUNTER — Inpatient Hospital Stay: Payer: 59

## 2019-12-12 ENCOUNTER — Other Ambulatory Visit: Payer: Self-pay | Admitting: Nurse Practitioner

## 2019-12-12 ENCOUNTER — Encounter: Payer: Self-pay | Admitting: Nurse Practitioner

## 2019-12-12 ENCOUNTER — Other Ambulatory Visit: Payer: Self-pay

## 2019-12-12 VITALS — BP 125/86 | HR 92 | Temp 97.9°F | Resp 16 | Ht 73.0 in | Wt 213.4 lb

## 2019-12-12 DIAGNOSIS — Z803 Family history of malignant neoplasm of breast: Secondary | ICD-10-CM | POA: Diagnosis not present

## 2019-12-12 DIAGNOSIS — Z8041 Family history of malignant neoplasm of ovary: Secondary | ICD-10-CM | POA: Diagnosis not present

## 2019-12-12 DIAGNOSIS — C187 Malignant neoplasm of sigmoid colon: Secondary | ICD-10-CM | POA: Diagnosis not present

## 2019-12-12 DIAGNOSIS — Z9221 Personal history of antineoplastic chemotherapy: Secondary | ICD-10-CM | POA: Diagnosis not present

## 2019-12-12 DIAGNOSIS — Z800 Family history of malignant neoplasm of digestive organs: Secondary | ICD-10-CM | POA: Diagnosis not present

## 2019-12-12 LAB — CBC WITH DIFFERENTIAL (CANCER CENTER ONLY)
Abs Immature Granulocytes: 0.02 10*3/uL (ref 0.00–0.07)
Basophils Absolute: 0.1 10*3/uL (ref 0.0–0.1)
Basophils Relative: 1 %
Eosinophils Absolute: 0.2 10*3/uL (ref 0.0–0.5)
Eosinophils Relative: 4 %
HCT: 41.2 % (ref 39.0–52.0)
Hemoglobin: 14.4 g/dL (ref 13.0–17.0)
Immature Granulocytes: 0 %
Lymphocytes Relative: 41 %
Lymphs Abs: 2.7 10*3/uL (ref 0.7–4.0)
MCH: 37.9 pg — ABNORMAL HIGH (ref 26.0–34.0)
MCHC: 35 g/dL (ref 30.0–36.0)
MCV: 108.4 fL — ABNORMAL HIGH (ref 80.0–100.0)
Monocytes Absolute: 0.6 10*3/uL (ref 0.1–1.0)
Monocytes Relative: 8 %
Neutro Abs: 3 10*3/uL (ref 1.7–7.7)
Neutrophils Relative %: 46 %
Platelet Count: 266 10*3/uL (ref 150–400)
RBC: 3.8 MIL/uL — ABNORMAL LOW (ref 4.22–5.81)
RDW: 15.3 % (ref 11.5–15.5)
WBC Count: 6.6 10*3/uL (ref 4.0–10.5)
nRBC: 0 % (ref 0.0–0.2)

## 2019-12-12 LAB — CMP (CANCER CENTER ONLY)
ALT: 18 U/L (ref 0–44)
AST: 19 U/L (ref 15–41)
Albumin: 4.2 g/dL (ref 3.5–5.0)
Alkaline Phosphatase: 61 U/L (ref 38–126)
Anion gap: 7 (ref 5–15)
BUN: 11 mg/dL (ref 6–20)
CO2: 30 mmol/L (ref 22–32)
Calcium: 9.7 mg/dL (ref 8.9–10.3)
Chloride: 103 mmol/L (ref 98–111)
Creatinine: 1.08 mg/dL (ref 0.61–1.24)
GFR, Estimated: >60 mL/min (ref >60)
Glucose, Bld: 113 mg/dL — ABNORMAL HIGH (ref 70–99)
Potassium: 3.7 mmol/L (ref 3.5–5.1)
Sodium: 140 mmol/L (ref 135–145)
Total Bilirubin: 0.7 mg/dL (ref 0.3–1.2)
Total Protein: 7.1 g/dL (ref 6.5–8.1)

## 2019-12-12 NOTE — Progress Notes (Signed)
  [place] OFFICE PROGRESS NOTE   Diagnosis: Colon cancer  INTERVAL HISTORY:   Brian Avery returns as scheduled.  He completed cycle 6 adjuvant Xeloda beginning 11/26/2019.  He denies nausea/vomiting.  No mouth sores.  Bowels tend alternate constipation/diarrhea.  No hand or foot pain or redness.  Objective:  Vital signs in last 24 hours:  Blood pressure 125/86, pulse 92, temperature 97.9 F (36.6 C), temperature source Tympanic, resp. rate 16, height $RemoveBe'6\' 1"'xjDPKIRIO$  (1.854 m), weight 213 lb 6.4 oz (96.8 kg), SpO2 100 %.    HEENT: No thrush or ulcers. Resp: Lungs clear bilaterally. Cardio: Regular rate and rhythm. GI: Abdomen soft and nontender.  No hepatomegaly. Vascular: No leg edema.  Skin: Palms with mild erythema, few areas of scattered callus formation.   Lab Results:  Lab Results  Component Value Date   WBC 6.6 12/12/2019   HGB 14.4 12/12/2019   HCT 41.2 12/12/2019   MCV 108.4 (H) 12/12/2019   PLT 266 12/12/2019   NEUTROABS 3.0 12/12/2019    Imaging:  No results found.  Medications: I have reviewed the patient's current medications.  Assessment/Plan: 1. Sigmoid colon cancer, stage II (T3N0) ? Colonoscopy 06/04/2019-cecum polyp, mass in the sigmoid colon beginning at 28 cm, biopsy confirmed a sessile serrated adenoma of the cecum, adenocarcinoma at the sigmoid colon ? CTs 06/05/2019-eccentric wall thickening of the proximal sigmoid colon with subcentimeter lymph nodes in the adjacent mesocolon, prominent nonspecific retroperitoneal nodes-largest 1 x 1 cm ? Robotic assisted sigmoidectomy 07/23/2019, well differentiated adenocarcinoma of the proximal sigmoid colon, tumor extends through the muscularis propria, positive lymphovascular invasion, negative resection margins, no loss of mismatch repair protein expression ? Cycle 1 adjuvant Xeloda 08/13/2019 ? Cycle 2 adjuvant Xeloda 09/03/2019, held on 09/15/2019 secondary to hand/foot pain and hives, resumed  09/16/2019 ? Cycle 3 adjuvant Xeloda 09/24/2019, dose reduction to 1500 mg twice daily secondary to hand/foot syndrome ? Cycle 4 adjuvant Xeloda 10/15/2019, 1500 mg twice daily ? Cycle 5 adjuvant Xeloda 11/05/2019, 1500 mg twice daily ? Cycle 6 adjuvant Xeloda 11/26/2019, 2000 mg every morning, 1500 mg every evening for 14 days ? Cycle 7 adjuvant Xeloda 12/17/2019, 2000 mg every morning and 1500 mg every evening for 14 days 2. Remote history of sarcoidosis-pulmonary and skin, he reports treatment with prednisone with resolution 3. Family history of multiple cancers including breast, ovarian, and a "gastrointestinal" malignancy  Disposition: Mr. [name] appears stable.  He has completed 6 cycles of adjuvant Xeloda.  The Xeloda was dose escalated with cycle 6.  He tolerated well.  Plan to proceed with cycle 7 as scheduled beginning 12/17/2019, same dose as cycle 6.  We reviewed the CBC from today.  Counts adequate to proceed as above.  He will return for lab and follow-up in 3 weeks.    Ned Card ANP/GNP-BC   12/12/2019  3:57 PM

## 2019-12-12 NOTE — Telephone Encounter (Signed)
xeloda refill request...Marland Kitchenok to refill

## 2019-12-13 ENCOUNTER — Telehealth: Payer: Self-pay

## 2019-12-13 ENCOUNTER — Telehealth: Payer: Self-pay | Admitting: Nurse Practitioner

## 2019-12-13 NOTE — Telephone Encounter (Signed)
Oral Oncology Patient Advocate Encounter  Received notification from [place] that prior authorization for Xeloda is required.  PA submitted on CoverMyMeds Key [id number] Status is pending  Oral Oncology Clinic will continue to follow.   [place] Patient [place] Phone [phone] Fax [phone] 12/13/2019 8:37 AM

## 2019-12-13 NOTE — Telephone Encounter (Signed)
Scheduled appointment  Per 10/27 los. Spoke to patient who is aware of appointment date and time.

## 2019-12-14 MED FILL — CAPECITABINE 500 MG TABLET: 500 | 21 days supply | Qty: 98 | Fill #0

## 2019-12-14 NOTE — Telephone Encounter (Signed)
Oral Oncology Patient Advocate Encounter  Prior Authorization for Xeloda has been approved.    PA# [id number] Effective dates: 2 fills 12/13/19 through 02/11/20   Oral Oncology Clinic will continue to follow.   [place] Patient [place] Phone [phone] Fax [phone] 12/14/2019 7:40 AM

## 2020-01-02 ENCOUNTER — Other Ambulatory Visit: Payer: Self-pay | Admitting: Oncology

## 2020-01-02 ENCOUNTER — Inpatient Hospital Stay: Payer: 59 | Attending: Oncology

## 2020-01-02 ENCOUNTER — Inpatient Hospital Stay (HOSPITAL_BASED_OUTPATIENT_CLINIC_OR_DEPARTMENT_OTHER): Payer: 59 | Admitting: Oncology

## 2020-01-02 ENCOUNTER — Other Ambulatory Visit: Payer: Self-pay

## 2020-01-02 VITALS — BP 143/90 | HR 70 | Temp 97.8°F | Resp 17 | Ht 73.0 in | Wt 211.0 lb

## 2020-01-02 DIAGNOSIS — C187 Malignant neoplasm of sigmoid colon: Secondary | ICD-10-CM

## 2020-01-02 LAB — CMP (CANCER CENTER ONLY)
ALT: 17 U/L (ref 0–44)
AST: 21 U/L (ref 15–41)
Albumin: 4.3 g/dL (ref 3.5–5.0)
Alkaline Phosphatase: 43 U/L (ref 38–126)
Anion gap: 10 (ref 5–15)
BUN: 9 mg/dL (ref 6–20)
CO2: 27 mmol/L (ref 22–32)
Calcium: 9 mg/dL (ref 8.9–10.3)
Chloride: 102 mmol/L (ref 98–111)
Creatinine: 0.93 mg/dL (ref 0.61–1.24)
GFR, Estimated: >60 mL/min (ref >60)
Glucose, Bld: 94 mg/dL (ref 70–99)
Potassium: 3.5 mmol/L (ref 3.5–5.1)
Sodium: 139 mmol/L (ref 135–145)
Total Bilirubin: 1 mg/dL (ref 0.3–1.2)
Total Protein: 7 g/dL (ref 6.5–8.1)

## 2020-01-02 LAB — CBC WITH DIFFERENTIAL (CANCER CENTER ONLY)
Abs Immature Granulocytes: 0.02 10*3/uL (ref 0.00–0.07)
Basophils Absolute: 0.1 10*3/uL (ref 0.0–0.1)
Basophils Relative: 1 %
Eosinophils Absolute: 0.2 10*3/uL (ref 0.0–0.5)
Eosinophils Relative: 4 %
HCT: 41.6 % (ref 39.0–52.0)
Hemoglobin: 14.5 g/dL (ref 13.0–17.0)
Immature Granulocytes: 0 %
Lymphocytes Relative: 46 %
Lymphs Abs: 2.4 10*3/uL (ref 0.7–4.0)
MCH: 38 pg — ABNORMAL HIGH (ref 26.0–34.0)
MCHC: 34.9 g/dL (ref 30.0–36.0)
MCV: 108.9 fL — ABNORMAL HIGH (ref 80.0–100.0)
Monocytes Absolute: 0.6 10*3/uL (ref 0.1–1.0)
Monocytes Relative: 12 %
Neutro Abs: 1.9 10*3/uL (ref 1.7–7.7)
Neutrophils Relative %: 37 %
Platelet Count: 207 10*3/uL (ref 150–400)
RBC: 3.82 MIL/uL — ABNORMAL LOW (ref 4.22–5.81)
RDW: 15.2 % (ref 11.5–15.5)
WBC Count: 5.3 10*3/uL (ref 4.0–10.5)
nRBC: 0 % (ref 0.0–0.2)

## 2020-01-02 MED ORDER — CAPECITABINE 500 MG PO TABS: ORAL_TABLET | ORAL | 0 refills | Status: DC

## 2020-01-02 MED FILL — CAPECITABINE 500 MG TABLET: 500 | 21 days supply | Qty: 98 | Fill #0

## 2020-01-02 NOTE — Progress Notes (Signed)
  [place] OFFICE PROGRESS NOTE   Diagnosis: Colon cancer  INTERVAL HISTORY:   Brian Avery completed another cycle of Xeloda beginning 12/17/2019.  No nausea, mouth sores, or diarrhea.  He continues to have erythema and skin thickening at the hands and feet.  Objective:  Vital signs in last 24 hours:  Blood pressure (!) 143/90, pulse 70, temperature 97.8 F (36.6 C), temperature source Tympanic, resp. rate 17, height $RemoveBe'6\' 1"'DOwxyrhKi$  (1.854 m), weight 211 lb (95.7 kg), SpO2 100 %.    HEENT: No thrush or ulcers Resp: Lungs clear bilaterally Cardio: Regular rate and rhythm GI: No hepatomegaly, nontender Vascular: No leg edema  Skin: Mild erythema and dryness of the palms, erythema, skin thickening, and superficial dry desquamation at the soles    Lab Results:  Lab Results  Component Value Date   WBC 5.3 01/02/2020   HGB 14.5 01/02/2020   HCT 41.6 01/02/2020   MCV 108.9 (H) 01/02/2020   PLT 207 01/02/2020   NEUTROABS 1.9 01/02/2020    CMP  Lab Results  Component Value Date   NA 139 01/02/2020   K 3.5 01/02/2020   CL 102 01/02/2020   CO2 27 01/02/2020   GLUCOSE 94 01/02/2020   BUN 9 01/02/2020   CREATININE 0.93 01/02/2020   CALCIUM 9.0 01/02/2020   PROT 7.0 01/02/2020   ALBUMIN 4.3 01/02/2020   AST 21 01/02/2020   ALT 17 01/02/2020   ALKPHOS 43 01/02/2020   BILITOT 1.0 01/02/2020   GFRNONAA >60 01/02/2020   GFRAA >60 10/31/2019    Lab Results  Component Value Date   CEA1 1.32 08/29/2019     Medications: I have reviewed the patient's current medications.   Assessment/Plan: 1. Sigmoid colon cancer, stage II (T3N0) ? Colonoscopy 06/04/2019-cecum polyp, mass in the sigmoid colon beginning at 28 cm, biopsy confirmed a sessile serrated adenoma of the cecum, adenocarcinoma at the sigmoid colon ? CTs 06/05/2019-eccentric wall thickening of the proximal sigmoid colon with subcentimeter lymph nodes in the adjacent mesocolon, prominent nonspecific  retroperitoneal nodes-largest 1 x 1 cm ? Robotic assisted sigmoidectomy 07/23/2019, well differentiated adenocarcinoma of the proximal sigmoid colon, tumor extends through the muscularis propria, positive lymphovascular invasion, negative resection margins, no loss of mismatch repair protein expression ? Cycle 1 adjuvant Xeloda 08/13/2019 ? Cycle 2 adjuvant Xeloda 09/03/2019, held on 09/15/2019 secondary to hand/foot pain and hives, resumed 09/16/2019 ? Cycle 3 adjuvant Xeloda 09/24/2019, dose reduction to 1500 mg twice daily secondary to hand/foot syndrome ? Cycle 4 adjuvant Xeloda 10/15/2019, 1500 mg twice daily ? Cycle 5 adjuvant Xeloda 11/05/2019, 1500 mg twice daily ? Cycle 6 adjuvant Xeloda 11/26/2019, 2000 mg every morning, 1500 mg every evening for 14 days ? Cycle 7 adjuvant Xeloda 12/17/2019, 2000 mg every morning and 1500 mg every evening for 14 days ? Cycle 8 adjuvant Xeloda 01/07/2020, 2000 mg every morning and 1500 mg every evening 2. Remote history of sarcoidosis-pulmonary and skin, he reports treatment with prednisone with resolution 3. Family history of multiple cancers including breast, ovarian, and a "gastrointestinal" malignancy    Disposition: Brian Avery appears stable.  He is completed 7 cycles of adjuvant Xeloda.  He will complete cycle 8 beginning 01/07/2020.  We decided to not reescalate the Xeloda dose due to the persistent hand/foot symptoms.  He will return for an office and lab visit in approximately 1 month.  Betsy Coder, MD  01/02/2020  9:21 AM

## 2020-01-03 ENCOUNTER — Telehealth: Payer: Self-pay | Admitting: Oncology

## 2020-01-03 NOTE — Telephone Encounter (Signed)
Scheduled appointments per 11/17 los. Called patient, no answer. Left message for patient with appointment date and time.

## 2020-01-15 DIAGNOSIS — F4323 Adjustment disorder with mixed anxiety and depressed mood: Secondary | ICD-10-CM | POA: Diagnosis not present

## 2020-01-17 DIAGNOSIS — F4323 Adjustment disorder with mixed anxiety and depressed mood: Secondary | ICD-10-CM | POA: Diagnosis not present

## 2020-01-24 DIAGNOSIS — F4323 Adjustment disorder with mixed anxiety and depressed mood: Secondary | ICD-10-CM | POA: Diagnosis not present

## 2020-01-29 DIAGNOSIS — F4323 Adjustment disorder with mixed anxiety and depressed mood: Secondary | ICD-10-CM | POA: Diagnosis not present

## 2020-01-31 ENCOUNTER — Ambulatory Visit: Payer: 59 | Admitting: Nurse Practitioner

## 2020-01-31 ENCOUNTER — Other Ambulatory Visit: Payer: 59

## 2020-01-31 DIAGNOSIS — F4323 Adjustment disorder with mixed anxiety and depressed mood: Secondary | ICD-10-CM | POA: Diagnosis not present

## 2020-02-01 ENCOUNTER — Inpatient Hospital Stay: Payer: 59 | Attending: Oncology

## 2020-02-01 ENCOUNTER — Encounter: Payer: Self-pay | Admitting: Nurse Practitioner

## 2020-02-01 ENCOUNTER — Other Ambulatory Visit: Payer: Self-pay

## 2020-02-01 ENCOUNTER — Inpatient Hospital Stay (HOSPITAL_BASED_OUTPATIENT_CLINIC_OR_DEPARTMENT_OTHER): Payer: 59 | Admitting: Nurse Practitioner

## 2020-02-01 VITALS — BP 130/88 | HR 69 | Temp 97.8°F | Resp 18 | Ht 73.0 in | Wt 202.9 lb

## 2020-02-01 DIAGNOSIS — Z800 Family history of malignant neoplasm of digestive organs: Secondary | ICD-10-CM | POA: Diagnosis not present

## 2020-02-01 DIAGNOSIS — C187 Malignant neoplasm of sigmoid colon: Secondary | ICD-10-CM | POA: Insufficient documentation

## 2020-02-01 LAB — CBC WITH DIFFERENTIAL (CANCER CENTER ONLY)
Abs Immature Granulocytes: 0.01 10*3/uL (ref 0.00–0.07)
Basophils Absolute: 0.1 10*3/uL (ref 0.0–0.1)
Basophils Relative: 1 %
Eosinophils Absolute: 0.2 10*3/uL (ref 0.0–0.5)
Eosinophils Relative: 4 %
HCT: 46.2 % (ref 39.0–52.0)
Hemoglobin: 16 g/dL (ref 13.0–17.0)
Immature Granulocytes: 0 %
Lymphocytes Relative: 40 %
Lymphs Abs: 2 10*3/uL (ref 0.7–4.0)
MCH: 37.5 pg — ABNORMAL HIGH (ref 26.0–34.0)
MCHC: 34.6 g/dL (ref 30.0–36.0)
MCV: 108.2 fL — ABNORMAL HIGH (ref 80.0–100.0)
Monocytes Absolute: 0.6 10*3/uL (ref 0.1–1.0)
Monocytes Relative: 12 %
Neutro Abs: 2.1 10*3/uL (ref 1.7–7.7)
Neutrophils Relative %: 43 %
Platelet Count: 254 10*3/uL (ref 150–400)
RBC: 4.27 MIL/uL (ref 4.22–5.81)
RDW: 15 % (ref 11.5–15.5)
WBC Count: 4.9 10*3/uL (ref 4.0–10.5)
nRBC: 0 % (ref 0.0–0.2)

## 2020-02-01 LAB — CMP (CANCER CENTER ONLY)
ALT: 25 U/L (ref 0–44)
AST: 38 U/L (ref 15–41)
Albumin: 4.5 g/dL (ref 3.5–5.0)
Alkaline Phosphatase: 45 U/L (ref 38–126)
Anion gap: 8 (ref 5–15)
BUN: 8 mg/dL (ref 6–20)
CO2: 27 mmol/L (ref 22–32)
Calcium: 9.9 mg/dL (ref 8.9–10.3)
Chloride: 106 mmol/L (ref 98–111)
Creatinine: 1.03 mg/dL (ref 0.61–1.24)
GFR, Estimated: >60 mL/min (ref >60)
Glucose, Bld: 88 mg/dL (ref 70–99)
Potassium: 4.4 mmol/L (ref 3.5–5.1)
Sodium: 141 mmol/L (ref 135–145)
Total Bilirubin: 0.6 mg/dL (ref 0.3–1.2)
Total Protein: 7.4 g/dL (ref 6.5–8.1)

## 2020-02-01 LAB — CEA (IN HOUSE-CHCC): CEA (CHCC-In House): 2.27 ng/mL (ref 0.00–5.00)

## 2020-02-01 NOTE — Progress Notes (Addendum)
[place] OFFICE PROGRESS NOTE   Diagnosis: Colon cancer  INTERVAL HISTORY:   Brian Avery returns as scheduled.  He completed the eighth and final cycle of adjuvant Xeloda on 01/20/2020.  He denies nausea/vomiting.  No mouth sores.  No change in bowel habits with typical being alternating constipation and diarrhea.  He notes slow improvement in symptoms of hand-foot syndrome.  Objective:  Vital signs in last 24 hours:  Blood pressure 130/88, pulse 69, temperature 97.8 F (36.6 C), temperature source Tympanic, resp. rate 18, height $RemoveBe'6\' 1"'GcCxpBSNQ$  (1.854 m), weight 202 lb 14.4 oz (92 kg), SpO2 100 %.    HEENT: Neck without mass.  No thrush or ulcers. Lymphatics: No palpable cervical, supraclavicular, axillary or inguinal lymph nodes. Resp: Lungs clear bilaterally. Cardio: Regular rate and rhythm. GI: Abdomen soft and nontender.  No hepatomegaly. Vascular: No leg edema.  Skin: Palms with mild erythema, dryness.   Lab Results:  Lab Results  Component Value Date   WBC 4.9 02/01/2020   HGB 16.0 02/01/2020   HCT 46.2 02/01/2020   MCV 108.2 (H) 02/01/2020   PLT 254 02/01/2020   NEUTROABS 2.1 02/01/2020    Imaging:  No results found.  Medications: I have reviewed the patient's current medications.  Assessment/Plan: 1. Sigmoid colon cancer, stage II (T3N0) ? Colonoscopy 06/04/2019-cecum polyp, mass in the sigmoid colon beginning at 28 cm, biopsy confirmed a sessile serrated adenoma of the cecum, adenocarcinoma at the sigmoid colon ? CTs 06/05/2019-eccentric wall thickening of the proximal sigmoid colon with subcentimeter lymph nodes in the adjacent mesocolon, prominent nonspecific retroperitoneal nodes-largest 1 x 1 cm ? Robotic assisted sigmoidectomy 07/23/2019, well differentiated adenocarcinoma of the proximal sigmoid colon, tumor extends through the muscularis propria, positive lymphovascular invasion, negative resection margins, no loss of mismatch repair protein  expression ? Cycle 1 adjuvant Xeloda 08/13/2019 ? Cycle 2 adjuvant Xeloda 09/03/2019, held on 09/15/2019 secondary to hand/foot pain and hives, resumed 09/16/2019 ? Cycle 3 adjuvant Xeloda 09/24/2019, dose reduction to 1500 mg twice daily secondary to hand/foot syndrome ? Cycle 4 adjuvant Xeloda 10/15/2019, 1500 mg twice daily ? Cycle 5 adjuvant Xeloda 11/05/2019, 1500 mg twice daily ? Cycle 6 adjuvant Xeloda 11/26/2019,2000 mg every morning, 1500 mg every evening for 14 days ? Cycle 7 adjuvant Xeloda 12/17/2019, 2000 mg every morning and 1500 mg every evening for 14 days ? Cycle 8 adjuvant Xeloda 01/07/2020, 2000 mg every morning and 1500 mg every evening 2. Remote history of sarcoidosis-pulmonary and skin, he reports treatment with prednisone with resolution 3. Family history of multiple cancers including breast, ovarian, and a "gastrointestinal" malignancy   Disposition: Brian Avery appears well.  He has completed the course of adjuvant Xeloda.  Hand-foot symptoms are improving.  We are referring him for surveillance CT scans in approximately 2 months.  He will return for follow-up a few days later to review those results.  He will contact the office in the interim with any problems.  After he left the office I noticed about 8 pounds of weight loss over the past month.  I contacted him by phone to discuss.  He reports a high stress level recently which he feels is directly related to the weight loss.  Ned Card ANP/GNP-BC   02/01/2020  9:23 AM

## 2020-02-04 ENCOUNTER — Telehealth: Payer: Self-pay | Admitting: Nurse Practitioner

## 2020-02-04 NOTE — Telephone Encounter (Signed)
Scheduled appointments per 12/17 los. Called patient, no answer. Left message with appointments dates and times.

## 2020-02-11 DIAGNOSIS — F4323 Adjustment disorder with mixed anxiety and depressed mood: Secondary | ICD-10-CM | POA: Diagnosis not present

## 2020-03-03 DIAGNOSIS — F4323 Adjustment disorder with mixed anxiety and depressed mood: Secondary | ICD-10-CM | POA: Diagnosis not present

## 2020-03-05 DIAGNOSIS — F4323 Adjustment disorder with mixed anxiety and depressed mood: Secondary | ICD-10-CM | POA: Diagnosis not present

## 2020-03-10 DIAGNOSIS — F4323 Adjustment disorder with mixed anxiety and depressed mood: Secondary | ICD-10-CM | POA: Diagnosis not present

## 2020-03-17 DIAGNOSIS — F4323 Adjustment disorder with mixed anxiety and depressed mood: Secondary | ICD-10-CM | POA: Diagnosis not present

## 2020-03-24 DIAGNOSIS — F4323 Adjustment disorder with mixed anxiety and depressed mood: Secondary | ICD-10-CM | POA: Diagnosis not present

## 2020-03-25 DIAGNOSIS — F4323 Adjustment disorder with mixed anxiety and depressed mood: Secondary | ICD-10-CM | POA: Diagnosis not present

## 2020-03-31 DIAGNOSIS — F4323 Adjustment disorder with mixed anxiety and depressed mood: Secondary | ICD-10-CM | POA: Diagnosis not present

## 2020-04-03 ENCOUNTER — Other Ambulatory Visit: Payer: Self-pay

## 2020-04-03 ENCOUNTER — Encounter (HOSPITAL_COMMUNITY): Payer: Self-pay

## 2020-04-03 ENCOUNTER — Inpatient Hospital Stay: Payer: 59 | Attending: Oncology

## 2020-04-03 ENCOUNTER — Ambulatory Visit (HOSPITAL_COMMUNITY)
Admission: RE | Admit: 2020-04-03 | Discharge: 2020-04-03 | Disposition: A | Discharge status: Home / Self Care | Payer: 59 | Source: Ambulatory Visit | Attending: Nurse Practitioner | Admitting: Nurse Practitioner

## 2020-04-03 DIAGNOSIS — M47814 Spondylosis without myelopathy or radiculopathy, thoracic region: Secondary | ICD-10-CM | POA: Diagnosis not present

## 2020-04-03 DIAGNOSIS — C187 Malignant neoplasm of sigmoid colon: Secondary | ICD-10-CM | POA: Diagnosis not present

## 2020-04-03 DIAGNOSIS — Z85038 Personal history of other malignant neoplasm of large intestine: Secondary | ICD-10-CM | POA: Diagnosis not present

## 2020-04-03 DIAGNOSIS — K449 Diaphragmatic hernia without obstruction or gangrene: Secondary | ICD-10-CM | POA: Diagnosis not present

## 2020-04-03 DIAGNOSIS — M47816 Spondylosis without myelopathy or radiculopathy, lumbar region: Secondary | ICD-10-CM | POA: Diagnosis not present

## 2020-04-03 LAB — CEA (IN HOUSE-CHCC): CEA (CHCC-In House): 1.49 ng/mL (ref 0.00–5.00)

## 2020-04-03 LAB — BASIC METABOLIC PANEL - CANCER CENTER ONLY
Anion gap: 6 (ref 5–15)
BUN: 10 mg/dL (ref 6–20)
CO2: 28 mmol/L (ref 22–32)
Calcium: 9 mg/dL (ref 8.9–10.3)
Chloride: 104 mmol/L (ref 98–111)
Creatinine: 1.01 mg/dL (ref 0.61–1.24)
GFR, Estimated: >60 mL/min (ref >60)
Glucose, Bld: 97 mg/dL (ref 70–99)
Potassium: 4.2 mmol/L (ref 3.5–5.1)
Sodium: 138 mmol/L (ref 135–145)

## 2020-04-03 MED ORDER — IOHEXOL 300 MG/ML  SOLN
100.0000 mL | Freq: Once | INTRAMUSCULAR | Status: AC | PRN
  Administered 2020-04-03: 100 mL via INTRAVENOUS

## 2020-04-07 ENCOUNTER — Inpatient Hospital Stay (HOSPITAL_BASED_OUTPATIENT_CLINIC_OR_DEPARTMENT_OTHER): Payer: 59 | Admitting: Oncology

## 2020-04-07 ENCOUNTER — Other Ambulatory Visit: Payer: Self-pay

## 2020-04-07 VITALS — BP 121/87 | HR 61 | Temp 96.3°F | Resp 18 | Ht 73.0 in | Wt 211.0 lb

## 2020-04-07 DIAGNOSIS — C187 Malignant neoplasm of sigmoid colon: Secondary | ICD-10-CM

## 2020-04-07 DIAGNOSIS — F4323 Adjustment disorder with mixed anxiety and depressed mood: Secondary | ICD-10-CM | POA: Diagnosis not present

## 2020-04-07 NOTE — Progress Notes (Signed)
Brian Avery OFFICE PROGRESS NOTE   Diagnosis: Colon cancer  INTERVAL HISTORY:   Brian Avery returns for a scheduled visit.  He feels well.  He is exercising.  No difficulty with bowel function.  The hands and feet have returned to normal.  No complaint.  Objective:  Vital signs in last 24 hours:  Blood pressure 121/87, pulse 61, temperature (!) 96.3 F (35.7 C), temperature source Tympanic, resp. rate 18, height 6' 1"  (1.854 m), weight 211 lb (95.7 kg), SpO2 100 %.   Lymphatics: No cervical, supraclavicular, axillary, or inguinal nodes Resp: Lungs clear bilaterally Cardio: Regular rate and rhythm GI: No mass, nontender, no hepatosplenomegaly Vascular: No leg edema  Lab Results:  Lab Results  Component Value Date   WBC 4.9 02/01/2020   HGB 16.0 02/01/2020   HCT 46.2 02/01/2020   MCV 108.2 (H) 02/01/2020   PLT 254 02/01/2020   NEUTROABS 2.1 02/01/2020    CMP  Lab Results  Component Value Date   NA 138 04/03/2020   K 4.2 04/03/2020   CL 104 04/03/2020   CO2 28 04/03/2020   GLUCOSE 97 04/03/2020   BUN 10 04/03/2020   CREATININE 1.01 04/03/2020   CALCIUM 9.0 04/03/2020   PROT 7.4 02/01/2020   ALBUMIN 4.5 02/01/2020   AST 38 02/01/2020   ALT 25 02/01/2020   ALKPHOS 45 02/01/2020   BILITOT 0.6 02/01/2020   GFRNONAA >60 04/03/2020   GFRAA >60 10/31/2019    Lab Results  Component Value Date   CEA1 1.49 04/03/2020     Imaging:  CT Chest W Contrast  Result Date: 04/03/2020 CLINICAL DATA:  History of sigmoid colon cancer status post surgical resection. Restaging. EXAM: CT CHEST, ABDOMEN, AND PELVIS WITH CONTRAST TECHNIQUE: Multidetector CT imaging of the chest, abdomen and pelvis was performed following the standard protocol during bolus administration of intravenous contrast. CONTRAST:  140m OMNIPAQUE IOHEXOL 300 MG/ML  SOLN COMPARISON:  06/05/2019 CT chest, abdomen and pelvis. FINDINGS: CT CHEST FINDINGS Cardiovascular: Normal heart size. No  significant pericardial effusion/thickening. Great vessels are normal in course and caliber. No central pulmonary emboli. Mediastinum/Nodes: No discrete thyroid nodules. Unremarkable esophagus. No pathologically enlarged axillary, mediastinal or hilar lymph nodes. Lungs/Pleura: No pneumothorax. No pleural effusion. No acute consolidative airspace disease, lung masses or significant pulmonary nodules. Musculoskeletal: No aggressive appearing focal osseous lesions. Mild thoracic spondylosis. CT ABDOMEN PELVIS FINDINGS Hepatobiliary: Normal liver with no liver mass. Normal gallbladder with no radiopaque cholelithiasis. No biliary ductal dilatation. Pancreas: Normal, with no mass or duct dilation. Spleen: Normal size. No mass. Adrenals/Urinary Tract: Normal adrenals. Normal kidneys with no hydronephrosis and no renal mass. Normal bladder. Stomach/Bowel: Small hiatal hernia. Otherwise normal nondistended stomach. Normal caliber small bowel with no small bowel wall thickening. Normal appendix. Oral contrast transits to the right colon. Interval partial distal colectomy with intact appearing distal colonic anastomosis. No large bowel wall thickening, diverticulosis or significant pericolonic fat stranding. Mild-to-moderate diffuse colonic stool. Vascular/Lymphatic: Normal caliber abdominal aorta. Patent portal, splenic, hepatic and renal veins. Mildly enlarged 1.0 cm short axis diameter left para-aortic node (series 2/image 74) is stable. No new pathologically enlarged lymph nodes in the abdomen or pelvis. Reproductive: Normal size prostate. Other: No pneumoperitoneum, ascites or focal fluid collection. Musculoskeletal: No aggressive appearing focal osseous lesions. Moderate lumbar spondylosis. IMPRESSION: 1. No evidence of local tumor recurrence status post partial distal colectomy. 2. No evidence of metastatic disease in the chest. 3. No findings suspicious for metastatic disease in the abdomen  or pelvis. Stable mild  left para-aortic lymphadenopathy, more likely reactive. 4. Small hiatal hernia. Electronically Signed   By: Ilona Sorrel M.D.   On: 04/03/2020 14:27   CT Abdomen Pelvis W Contrast  Result Date: 04/03/2020 CLINICAL DATA:  History of sigmoid colon cancer status post surgical resection. Restaging. EXAM: CT CHEST, ABDOMEN, AND PELVIS WITH CONTRAST TECHNIQUE: Multidetector CT imaging of the chest, abdomen and pelvis was performed following the standard protocol during bolus administration of intravenous contrast. CONTRAST:  143m OMNIPAQUE IOHEXOL 300 MG/ML  SOLN COMPARISON:  06/05/2019 CT chest, abdomen and pelvis. FINDINGS: CT CHEST FINDINGS Cardiovascular: Normal heart size. No significant pericardial effusion/thickening. Great vessels are normal in course and caliber. No central pulmonary emboli. Mediastinum/Nodes: No discrete thyroid nodules. Unremarkable esophagus. No pathologically enlarged axillary, mediastinal or hilar lymph nodes. Lungs/Pleura: No pneumothorax. No pleural effusion. No acute consolidative airspace disease, lung masses or significant pulmonary nodules. Musculoskeletal: No aggressive appearing focal osseous lesions. Mild thoracic spondylosis. CT ABDOMEN PELVIS FINDINGS Hepatobiliary: Normal liver with no liver mass. Normal gallbladder with no radiopaque cholelithiasis. No biliary ductal dilatation. Pancreas: Normal, with no mass or duct dilation. Spleen: Normal size. No mass. Adrenals/Urinary Tract: Normal adrenals. Normal kidneys with no hydronephrosis and no renal mass. Normal bladder. Stomach/Bowel: Small hiatal hernia. Otherwise normal nondistended stomach. Normal caliber small bowel with no small bowel wall thickening. Normal appendix. Oral contrast transits to the right colon. Interval partial distal colectomy with intact appearing distal colonic anastomosis. No large bowel wall thickening, diverticulosis or significant pericolonic fat stranding. Mild-to-moderate diffuse colonic stool.  Vascular/Lymphatic: Normal caliber abdominal aorta. Patent portal, splenic, hepatic and renal veins. Mildly enlarged 1.0 cm short axis diameter left para-aortic node (series 2/image 74) is stable. No new pathologically enlarged lymph nodes in the abdomen or pelvis. Reproductive: Normal size prostate. Other: No pneumoperitoneum, ascites or focal fluid collection. Musculoskeletal: No aggressive appearing focal osseous lesions. Moderate lumbar spondylosis. IMPRESSION: 1. No evidence of local tumor recurrence status post partial distal colectomy. 2. No evidence of metastatic disease in the chest. 3. No findings suspicious for metastatic disease in the abdomen or pelvis. Stable mild left para-aortic lymphadenopathy, more likely reactive. 4. Small hiatal hernia. Electronically Signed   By: JIlona SorrelM.D.   On: 04/03/2020 14:27    Medications: I have reviewed the patient's current medications.   Assessment/Plan: 1. Sigmoid colon cancer, stage II (T3N0) ? Colonoscopy 06/04/2019-cecum polyp, mass in the sigmoid colon beginning at 28 cm, biopsy confirmed a sessile serrated adenoma of the cecum, adenocarcinoma at the sigmoid colon ? CTs 06/05/2019-eccentric wall thickening of the proximal sigmoid colon with subcentimeter lymph nodes in the adjacent mesocolon, prominent nonspecific retroperitoneal nodes-largest 1 x 1 cm ? Robotic assisted sigmoidectomy 07/23/2019, well differentiated adenocarcinoma of the proximal sigmoid colon, tumor extends through the muscularis propria, positive lymphovascular invasion, negative resection margins, no loss of mismatch repair protein expression ? Cycle 1 adjuvant Xeloda 08/13/2019 ? Cycle 2 adjuvant Xeloda 09/03/2019, held on 09/15/2019 secondary to hand/foot pain and hives, resumed 09/16/2019 ? Cycle 3 adjuvant Xeloda 09/24/2019, dose reduction to 1500 mg twice daily secondary to hand/foot syndrome ? Cycle 4 adjuvant Xeloda 10/15/2019, 1500 mg twice daily ? Cycle 5 adjuvant Xeloda  11/05/2019, 1500 mg twice daily ? Cycle 6 adjuvant Xeloda 11/26/2019,2000 mg every morning, 1500 mg every evening for 14 days ? Cycle 7 adjuvant Xeloda 12/17/2019, 2000 mg every morning and 1500 mg every evening for 14 days ? Cycle 8 adjuvant Xeloda 01/07/2020, 2000 mg every morning  and 1500 mg every evening ? CTs 04/03/2020-no evidence of metastatic disease, stable mildly enlarged left periaortic node 2. Remote history of sarcoidosis-pulmonary and skin, he reports treatment with prednisone with resolution 3. Family history of multiple cancers including breast, ovarian, and a "gastrointestinal" malignancy     Disposition: Brian Avery is in clinical remission from colon cancer.  He will return for an office visit and CEA in 6 months.  He will schedule a colonoscopy with Dr. Silverio Decamp in April.  Betsy Coder, MD  04/07/2020  8:07 AM

## 2020-04-08 ENCOUNTER — Telehealth: Payer: Self-pay | Admitting: Oncology

## 2020-04-08 NOTE — Telephone Encounter (Signed)
Scheduled appointments per 2/21 los. Spoke to patient who is aware of appointments date and times.  

## 2020-04-16 DIAGNOSIS — F4323 Adjustment disorder with mixed anxiety and depressed mood: Secondary | ICD-10-CM | POA: Diagnosis not present

## 2020-04-30 DIAGNOSIS — F4323 Adjustment disorder with mixed anxiety and depressed mood: Secondary | ICD-10-CM | POA: Diagnosis not present

## 2020-05-09 ENCOUNTER — Other Ambulatory Visit (HOSPITAL_COMMUNITY): Payer: Self-pay

## 2020-05-12 DIAGNOSIS — R1900 Intra-abdominal and pelvic swelling, mass and lump, unspecified site: Secondary | ICD-10-CM | POA: Diagnosis not present

## 2020-05-12 DIAGNOSIS — Z85038 Personal history of other malignant neoplasm of large intestine: Secondary | ICD-10-CM | POA: Diagnosis not present

## 2020-05-12 DIAGNOSIS — Z13220 Encounter for screening for lipoid disorders: Secondary | ICD-10-CM | POA: Diagnosis not present

## 2020-05-12 DIAGNOSIS — Z862 Personal history of diseases of the blood and blood-forming organs and certain disorders involving the immune mechanism: Secondary | ICD-10-CM | POA: Diagnosis not present

## 2020-05-13 DIAGNOSIS — F4323 Adjustment disorder with mixed anxiety and depressed mood: Secondary | ICD-10-CM | POA: Diagnosis not present

## 2020-05-14 DIAGNOSIS — F4323 Adjustment disorder with mixed anxiety and depressed mood: Secondary | ICD-10-CM | POA: Diagnosis not present

## 2020-05-28 DIAGNOSIS — F4323 Adjustment disorder with mixed anxiety and depressed mood: Secondary | ICD-10-CM | POA: Diagnosis not present

## 2020-06-09 ENCOUNTER — Other Ambulatory Visit (HOSPITAL_COMMUNITY): Payer: Self-pay

## 2020-06-09 ENCOUNTER — Other Ambulatory Visit: Payer: Self-pay

## 2020-06-09 ENCOUNTER — Ambulatory Visit (AMBULATORY_SURGERY_CENTER): Payer: Self-pay | Admitting: *Deleted

## 2020-06-09 VITALS — Ht 72.0 in | Wt 205.0 lb

## 2020-06-09 DIAGNOSIS — Z85038 Personal history of other malignant neoplasm of large intestine: Secondary | ICD-10-CM

## 2020-06-09 MED ORDER — SUPREP BOWEL PREP KIT 17.5-3.13-1.6 GM/177ML PO SOLN
1.0000 | Freq: Once | ORAL | 0 refills | Status: AC
  Filled 2020-06-09 – 2020-06-18 (×2): qty 354, 1d supply, fill #0

## 2020-06-09 NOTE — Progress Notes (Signed)

## 2020-06-17 ENCOUNTER — Other Ambulatory Visit (HOSPITAL_COMMUNITY): Payer: Self-pay

## 2020-06-18 ENCOUNTER — Other Ambulatory Visit (HOSPITAL_COMMUNITY): Payer: Self-pay

## 2020-06-19 DIAGNOSIS — F4323 Adjustment disorder with mixed anxiety and depressed mood: Secondary | ICD-10-CM | POA: Diagnosis not present

## 2020-06-23 ENCOUNTER — Encounter: Payer: Self-pay | Admitting: Gastroenterology

## 2020-06-23 ENCOUNTER — Other Ambulatory Visit: Payer: Self-pay | Admitting: Gastroenterology

## 2020-06-23 ENCOUNTER — Other Ambulatory Visit: Payer: Self-pay

## 2020-06-23 ENCOUNTER — Ambulatory Visit (AMBULATORY_SURGERY_CENTER): Payer: 59 | Admitting: Gastroenterology

## 2020-06-23 VITALS — BP 103/63 | HR 53 | Temp 97.5°F | Resp 14 | Ht 72.0 in | Wt 205.0 lb

## 2020-06-23 DIAGNOSIS — Z1211 Encounter for screening for malignant neoplasm of colon: Secondary | ICD-10-CM | POA: Diagnosis not present

## 2020-06-23 DIAGNOSIS — Z85038 Personal history of other malignant neoplasm of large intestine: Secondary | ICD-10-CM | POA: Diagnosis not present

## 2020-06-23 DIAGNOSIS — D123 Benign neoplasm of transverse colon: Secondary | ICD-10-CM

## 2020-06-23 DIAGNOSIS — E669 Obesity, unspecified: Secondary | ICD-10-CM | POA: Diagnosis not present

## 2020-06-23 DIAGNOSIS — D124 Benign neoplasm of descending colon: Secondary | ICD-10-CM

## 2020-06-23 DIAGNOSIS — K635 Polyp of colon: Secondary | ICD-10-CM

## 2020-06-23 DIAGNOSIS — D122 Benign neoplasm of ascending colon: Secondary | ICD-10-CM

## 2020-06-23 MED ORDER — SODIUM CHLORIDE 0.9 % IV SOLN: 500.0000 mL | Freq: Once | INTRAVENOUS | Status: DC

## 2020-06-23 NOTE — Progress Notes (Signed)
Pt's states no medical or surgical changes since previsit or office visit.   VS taken by CW 

## 2020-06-23 NOTE — Patient Instructions (Signed)
Resume previous diet and medications. Awaiting pathology results. Repeat colonoscopy in 1 year for surveillance based on pathology results.  YOU HAD AN ENDOSCOPIC PROCEDURE TODAY AT [facility]:   Refer to the procedure report that was given to you for any specific questions about what was found during the examination.  If the procedure report does not answer your questions, please call your gastroenterologist to clarify.  If you requested that your care partner not be given the details of your procedure findings, then the procedure report has been included in a sealed envelope for you to review at your convenience later.  YOU SHOULD EXPECT: Some feelings of bloating in the abdomen. Passage of more gas than usual.  Walking can help get rid of the air that was put into your GI tract during the procedure and reduce the bloating. If you had a lower endoscopy (such as a colonoscopy or flexible sigmoidoscopy) you may notice spotting of blood in your stool or on the toilet paper. If you underwent a bowel prep for your procedure, you may not have a normal bowel movement for a few days.  Please Note:  You might notice some irritation and congestion in your nose or some drainage.  This is from the oxygen used during your procedure.  There is no need for concern and it should clear up in a day or so.  SYMPTOMS TO REPORT IMMEDIATELY:   Following lower endoscopy (colonoscopy or flexible sigmoidoscopy):  Excessive amounts of blood in the stool  Significant tenderness or worsening of abdominal pains  Swelling of the abdomen that is new, acute  Fever of 100F or higher   For urgent or emergent issues, a gastroenterologist can be reached at any hour by calling [phone]. Do not use MyChart messaging for urgent concerns.    DIET:  We do recommend a small meal at first, but then you may proceed to your regular diet.  Drink plenty of fluids but you should avoid alcoholic beverages for 24  hours.  ACTIVITY:  You should plan to take it easy for the rest of today and you should NOT DRIVE or use heavy machinery until tomorrow (because of the sedation medicines used during the test).    FOLLOW UP: Our staff will call the number listed on your records 48-72 hours following your procedure to check on you and address any questions or concerns that you may have regarding the information given to you following your procedure. If we do not reach you, we will leave a message.  We will attempt to reach you two times.  During this call, we will ask if you have developed any symptoms of COVID 19. If you develop any symptoms (ie: fever, flu-like symptoms, shortness of breath, cough etc.) before then, please call [phone].  If you test positive for Covid 19 in the 2 weeks post procedure, please call and report this information to [place].    If any biopsies were taken you will be contacted by phone or by letter within the next 1-3 weeks.  Please call us at [phone] if you have not heard about the biopsies in 3 weeks.    SIGNATURES/CONFIDENTIALITY: You and/or your care partner have signed paperwork which will be entered into your electronic medical record.  These signatures attest to the fact that that the information above on your After Visit Summary has been reviewed and is understood.  Full responsibility of the confidentiality of this discharge information lies with you and/or  your care-partner. 

## 2020-06-23 NOTE — Progress Notes (Signed)
Report to PACU, RN, vss, BBS= Clear.  

## 2020-06-23 NOTE — Progress Notes (Signed)
Called to room to assist during endoscopic procedure.  Patient ID and intended procedure confirmed with present staff. Received instructions for my participation in the procedure from the performing physician.  

## 2020-06-23 NOTE — Op Note (Signed)
[facility] Patient Name: Pau Banh Procedure Date: 06/23/2020 7:26 AM MRN: [id number] Endoscopist: Mauri Pole , MD Age: 55 Referring MD:  Date of Birth: 02-21-1965 Gender: Male Account #: [id number] Procedure:                Colonoscopy Indications:              High risk colon cancer surveillance: Personal                            history of adenoma (10 mm or greater in size), High                            risk colon cancer surveillance: Personal history of                            colon cancer Medicines:                Monitored Anesthesia Care Procedure:                Pre-Anesthesia Assessment:                           - Prior to the procedure, a History and Physical                            was performed, and patient medications and                            allergies were reviewed. The patient's tolerance of                            previous anesthesia was also reviewed. The risks                            and benefits of the procedure and the sedation                            options and risks were discussed with the patient.                            All questions were answered, and informed consent                            was obtained. Prior Anticoagulants: The patient has                            taken no previous anticoagulant or antiplatelet                            agents. ASA Grade Assessment: II - A patient with                            mild systemic disease. After reviewing the risks  and benefits, the patient was deemed in                            satisfactory condition to undergo the procedure.                           After obtaining informed consent, the colonoscope                            was passed under direct vision. Throughout the                            procedure, the patient's blood pressure, pulse, and                            oxygen saturations were monitored continuously.  The                            Olympus PCF-H190DL ([id number]) Colonoscope was                            introduced through the anus and advanced to the the                            terminal ileum, with identification of the                            appendiceal orifice and IC valve. The colonoscopy                            was performed without difficulty. The patient                            tolerated the procedure well. The quality of the                            bowel preparation was adequate to identify polyps 6                            mm and larger in size. The terminal ileum,                            ileocecal valve, appendiceal orifice, and rectum                            were photographed. Scope In: 8:14:40 AM Scope Out: 8:40:47 AM Scope Withdrawal Time: 0 hours 22 minutes 50 seconds  Total Procedure Duration: 0 hours 26 minutes 7 seconds  Findings:                 The perianal and digital rectal examinations were                            normal.  Five sessile polyps were found in the transverse                            colon and ascending colon. The polyps were 4 to 11                            mm in size. These polyps were removed with a cold                            snare. Resection and retrieval were complete.                           There was evidence of a prior end-to-end                            colo-colonic anastomosis in the sigmoid colon. This                            was patent and was characterized by an intact                            staple line. The anastomosis was traversed.                           Non-bleeding external and internal hemorrhoids were                            found during retroflexion. The hemorrhoids were                            medium-sized. Complications:            No immediate complications. Estimated Blood Loss:     Estimated blood loss was minimal. Impression:               -  Five 4 to 11 mm polyps in the transverse colon                            and in the ascending colon, removed with a cold                            snare. Resected and retrieved.                           - Patent end-to-end colo-colonic anastomosis,                            characterized by an intact staple line.                           - Non-bleeding external and internal hemorrhoids. Recommendation:           - Patient has a contact number available for  emergencies. The signs and symptoms of potential                            delayed complications were discussed with the                            patient. Return to normal activities tomorrow.                            Written discharge instructions were provided to the                            patient.                           - Resume previous diet.                           - Continue present medications.                           - Await pathology results.                           - Repeat colonoscopy in 1 year for surveillance                            based on pathology results.                           - For future colonoscopy the patient will require                            an extended preparation. If there are any                            questions, please contact the gastroenterologist. Mauri Pole, MD 06/23/2020 8:49:27 AM This report has been signed electronically.

## 2020-06-25 ENCOUNTER — Telehealth: Payer: Self-pay

## 2020-06-25 ENCOUNTER — Telehealth: Payer: Self-pay | Admitting: *Deleted

## 2020-06-25 DIAGNOSIS — F4323 Adjustment disorder with mixed anxiety and depressed mood: Secondary | ICD-10-CM | POA: Diagnosis not present

## 2020-06-25 NOTE — Telephone Encounter (Signed)
  Follow up Call-  Call back number 06/23/2020 06/04/2019  Post procedure Call Back phone  # [phone]  Permission to leave phone message Yes Yes  Some recent data might be hidden     Patient questions:  Do you have a fever, pain , or abdominal swelling? No. Pain Score  0 *  Have you tolerated food without any problems? Yes.    Have you been able to return to your normal activities? Yes.    Do you have any questions about your discharge instructions: Diet   No. Medications  No. Follow up visit  No.  Do you have questions or concerns about your Care? No.  Actions: * If pain score is 4 or above: No action needed, pain <4.

## 2020-06-25 NOTE — Telephone Encounter (Signed)
Attempted f/u phone call. No answer. Left message. °

## 2020-06-27 DIAGNOSIS — M5416 Radiculopathy, lumbar region: Secondary | ICD-10-CM | POA: Diagnosis not present

## 2020-07-04 ENCOUNTER — Encounter: Payer: Self-pay | Admitting: Gastroenterology

## 2020-07-17 DIAGNOSIS — M545 Low back pain, unspecified: Secondary | ICD-10-CM | POA: Diagnosis not present

## 2020-07-17 DIAGNOSIS — F4323 Adjustment disorder with mixed anxiety and depressed mood: Secondary | ICD-10-CM | POA: Diagnosis not present

## 2020-07-23 DIAGNOSIS — F4323 Adjustment disorder with mixed anxiety and depressed mood: Secondary | ICD-10-CM | POA: Diagnosis not present

## 2020-09-20 DIAGNOSIS — S42295A Other nondisplaced fracture of upper end of left humerus, initial encounter for closed fracture: Secondary | ICD-10-CM | POA: Diagnosis not present

## 2020-09-20 DIAGNOSIS — S43005A Unspecified dislocation of left shoulder joint, initial encounter: Secondary | ICD-10-CM | POA: Diagnosis not present

## 2020-09-20 DIAGNOSIS — Z85038 Personal history of other malignant neoplasm of large intestine: Secondary | ICD-10-CM | POA: Diagnosis not present

## 2020-09-20 DIAGNOSIS — S43015A Anterior dislocation of left humerus, initial encounter: Secondary | ICD-10-CM | POA: Diagnosis not present

## 2020-09-20 DIAGNOSIS — S43085A Other dislocation of left shoulder joint, initial encounter: Secondary | ICD-10-CM | POA: Diagnosis not present

## 2020-09-20 DIAGNOSIS — W19XXXA Unspecified fall, initial encounter: Secondary | ICD-10-CM | POA: Diagnosis not present

## 2020-09-20 DIAGNOSIS — M21922 Unspecified acquired deformity of left upper arm: Secondary | ICD-10-CM | POA: Diagnosis not present

## 2020-09-23 DIAGNOSIS — M25512 Pain in left shoulder: Secondary | ICD-10-CM | POA: Diagnosis not present

## 2020-10-06 ENCOUNTER — Inpatient Hospital Stay: Payer: 59 | Attending: Oncology | Admitting: Oncology

## 2020-10-06 ENCOUNTER — Other Ambulatory Visit: Payer: Self-pay

## 2020-10-06 ENCOUNTER — Inpatient Hospital Stay: Payer: 59

## 2020-10-06 ENCOUNTER — Other Ambulatory Visit: Payer: 59

## 2020-10-06 VITALS — BP 111/81 | HR 62 | Temp 97.8°F | Resp 18 | Ht 72.0 in | Wt 213.2 lb

## 2020-10-06 DIAGNOSIS — C187 Malignant neoplasm of sigmoid colon: Secondary | ICD-10-CM | POA: Diagnosis not present

## 2020-10-06 DIAGNOSIS — Z9221 Personal history of antineoplastic chemotherapy: Secondary | ICD-10-CM | POA: Diagnosis not present

## 2020-10-06 DIAGNOSIS — D125 Benign neoplasm of sigmoid colon: Secondary | ICD-10-CM | POA: Insufficient documentation

## 2020-10-06 DIAGNOSIS — C180 Malignant neoplasm of cecum: Secondary | ICD-10-CM | POA: Diagnosis not present

## 2020-10-06 LAB — CEA (IN HOUSE-CHCC): CEA (CHCC-In House): 1.69 ng/mL (ref 0.00–5.00)

## 2020-10-06 LAB — CEA (ACCESS): CEA (CHCC): <1 ng/mL (ref 0.00–5.00)

## 2020-10-06 NOTE — Progress Notes (Signed)
Brian Avery OFFICE PROGRESS NOTE   Diagnosis: Colon cancer  INTERVAL HISTORY:   Mr Brian Avery returns as scheduled.  He feels well.  Good appetite.  No difficulty with bowel function.  He has noted a "bulge "at the right inguinal area starting after he began a "[employer] "program earlier this year.  He feels that he has a hernia.  The area is not painful. He underwent a colonoscopy on 06/23/2020.  5 polyps were found in the transverse and ascending colon.  The polyps were removed.  Objective:  Vital signs in last 24 hours:  Blood pressure 111/81, pulse 62, temperature 97.8 F (36.6 C), temperature source Oral, resp. rate 18, height 6' (1.829 m), weight 213 lb 3.2 oz (96.7 kg), SpO2 100 %.    Lymphatics: No cervical, supraclavicular, axillary, or inguinal nodes Resp: Lungs clear bilaterally Cardio: Regular rate and rhythm GI: No hepatosplenomegaly, no mass, nontender, soft small reducible right inguinal hernia Vascular: No leg edema  Lab Results:  Lab Results  Component Value Date   WBC 4.9 02/01/2020   HGB 16.0 02/01/2020   HCT 46.2 02/01/2020   MCV 108.2 (H) 02/01/2020   PLT 254 02/01/2020   NEUTROABS 2.1 02/01/2020    CMP  Lab Results  Component Value Date   NA 138 04/03/2020   K 4.2 04/03/2020   CL 104 04/03/2020   CO2 28 04/03/2020   GLUCOSE 97 04/03/2020   BUN 10 04/03/2020   CREATININE 1.01 04/03/2020   CALCIUM 9.0 04/03/2020   PROT 7.4 02/01/2020   ALBUMIN 4.5 02/01/2020   AST 38 02/01/2020   ALT 25 02/01/2020   ALKPHOS 45 02/01/2020   BILITOT 0.6 02/01/2020   GFRNONAA >60 04/03/2020   GFRAA >60 10/31/2019    Lab Results  Component Value Date   CEA1 1.49 04/03/2020    Medications: I have reviewed the patient's current medications.   Assessment/Plan: Sigmoid colon cancer, stage II (T3N0) Colonoscopy 06/04/2019-cecum polyp, mass in the sigmoid colon beginning at 28 cm, biopsy confirmed a sessile serrated adenoma of the cecum,  adenocarcinoma at the sigmoid colon CTs 06/05/2019-eccentric wall thickening of the proximal sigmoid colon with subcentimeter lymph nodes in the adjacent mesocolon, prominent nonspecific retroperitoneal nodes-largest 1 x 1 cm Robotic assisted sigmoidectomy 07/23/2019, well differentiated adenocarcinoma of the proximal sigmoid colon, tumor extends through the muscularis propria, positive lymphovascular invasion, negative resection margins, no loss of mismatch repair protein expression Cycle 1 adjuvant Xeloda 08/13/2019 Cycle 2 adjuvant Xeloda 09/03/2019, held on 09/15/2019 secondary to hand/foot pain and hives, resumed 09/16/2019 Cycle 3 adjuvant Xeloda 09/24/2019, dose reduction to 1500 mg twice daily secondary to hand/foot syndrome Cycle 4 adjuvant Xeloda 10/15/2019, 1500 mg twice daily Cycle 5 adjuvant Xeloda 11/05/2019, 1500 mg twice daily Cycle 6 adjuvant Xeloda 11/26/2019, 2000 mg every morning, 1500 mg every evening for 14 days Cycle 7 adjuvant Xeloda 12/17/2019, 2000 mg every morning and 1500 mg every evening for 14 days Cycle 8 adjuvant Xeloda 01/07/2020, 2000 mg every morning and 1500 mg every evening CTs 04/03/2020-no evidence of metastatic disease, stable mildly enlarged left periaortic node Colonoscopy 06/23/2020-multiple polyps removed from the transverse and ascending colon, tubular adenoma and sessile serrated polyps, no high-grade dysplasia or malignancy Remote history of sarcoidosis-pulmonary and skin, he reports treatment with prednisone with resolution Family history of multiple cancers including breast, ovarian, and a "gastrointestinal" malignancy Custom next-Cancer panel-BARD1 VUS    Disposition:  Mr Brian Avery is in clinical remission from colon cancer.  We will follow-up on  the CEA from today.  He had multiple polyps on colonoscopy earlier this year.  He will continue colonoscopy surveillance with Dr. Silverio Decamp.  Mr Brian Avery will follow-up with Dr. Dema Severin if the right inguinal hernia becomes  larger or painful.  He will return for an office visit and surveillance CTs in 6 months.  Betsy Coder, MD  10/06/2020  8:41 AM

## 2020-10-08 ENCOUNTER — Telehealth: Payer: Self-pay

## 2020-10-08 NOTE — Telephone Encounter (Signed)
[facility] testing requested from pathology on resected colon cancer case number [id number] Per Dr Sherrill  

## 2020-10-15 ENCOUNTER — Encounter (HOSPITAL_COMMUNITY): Payer: Self-pay

## 2020-10-15 DIAGNOSIS — S43005A Unspecified dislocation of left shoulder joint, initial encounter: Secondary | ICD-10-CM | POA: Diagnosis not present

## 2020-10-15 DIAGNOSIS — M25512 Pain in left shoulder: Secondary | ICD-10-CM | POA: Diagnosis not present

## 2020-10-30 DIAGNOSIS — M25512 Pain in left shoulder: Secondary | ICD-10-CM | POA: Diagnosis not present

## 2020-11-10 DIAGNOSIS — M25512 Pain in left shoulder: Secondary | ICD-10-CM | POA: Diagnosis not present

## 2020-11-20 DIAGNOSIS — M25512 Pain in left shoulder: Secondary | ICD-10-CM | POA: Diagnosis not present

## 2021-04-01 ENCOUNTER — Other Ambulatory Visit: Payer: Self-pay

## 2021-04-01 ENCOUNTER — Ambulatory Visit (HOSPITAL_BASED_OUTPATIENT_CLINIC_OR_DEPARTMENT_OTHER)
Admission: RE | Admit: 2021-04-01 | Discharge: 2021-04-01 | Disposition: A | Discharge status: Home / Self Care | Payer: 59 | Source: Ambulatory Visit | Attending: Oncology | Admitting: Oncology

## 2021-04-01 ENCOUNTER — Inpatient Hospital Stay: Payer: 59 | Attending: Oncology

## 2021-04-01 DIAGNOSIS — D125 Benign neoplasm of sigmoid colon: Secondary | ICD-10-CM | POA: Insufficient documentation

## 2021-04-01 DIAGNOSIS — D122 Benign neoplasm of ascending colon: Secondary | ICD-10-CM | POA: Insufficient documentation

## 2021-04-01 DIAGNOSIS — C187 Malignant neoplasm of sigmoid colon: Secondary | ICD-10-CM | POA: Insufficient documentation

## 2021-04-01 DIAGNOSIS — Z803 Family history of malignant neoplasm of breast: Secondary | ICD-10-CM | POA: Insufficient documentation

## 2021-04-01 DIAGNOSIS — Z79899 Other long term (current) drug therapy: Secondary | ICD-10-CM | POA: Insufficient documentation

## 2021-04-01 DIAGNOSIS — Z800 Family history of malignant neoplasm of digestive organs: Secondary | ICD-10-CM | POA: Insufficient documentation

## 2021-04-01 DIAGNOSIS — Z8041 Family history of malignant neoplasm of ovary: Secondary | ICD-10-CM | POA: Insufficient documentation

## 2021-04-01 DIAGNOSIS — Z86010 Personal history of colonic polyps: Secondary | ICD-10-CM | POA: Insufficient documentation

## 2021-04-01 DIAGNOSIS — Z0389 Encounter for observation for other suspected diseases and conditions ruled out: Secondary | ICD-10-CM | POA: Diagnosis not present

## 2021-04-01 LAB — CEA (ACCESS): CEA (CHCC): <1 ng/mL (ref 0.00–5.00)

## 2021-04-01 LAB — BASIC METABOLIC PANEL - CANCER CENTER ONLY
Anion gap: 8 (ref 5–15)
BUN: 18 mg/dL (ref 6–20)
CO2: 28 mmol/L (ref 22–32)
Calcium: 9 mg/dL (ref 8.9–10.3)
Chloride: 102 mmol/L (ref 98–111)
Creatinine: 0.98 mg/dL (ref 0.61–1.24)
GFR, Estimated: >60 mL/min (ref >60)
Glucose, Bld: 92 mg/dL (ref 70–99)
Potassium: 4.6 mmol/L (ref 3.5–5.1)
Sodium: 138 mmol/L (ref 135–145)

## 2021-04-01 MED ORDER — IOHEXOL 300 MG/ML  SOLN
100.0000 mL | Freq: Once | INTRAMUSCULAR | Status: AC | PRN
  Administered 2021-04-01: 100 mL via INTRAVENOUS

## 2021-04-03 ENCOUNTER — Other Ambulatory Visit: Payer: Self-pay

## 2021-04-03 ENCOUNTER — Encounter: Payer: Self-pay | Admitting: Nurse Practitioner

## 2021-04-03 ENCOUNTER — Inpatient Hospital Stay (HOSPITAL_BASED_OUTPATIENT_CLINIC_OR_DEPARTMENT_OTHER): Payer: 59 | Admitting: Nurse Practitioner

## 2021-04-03 VITALS — BP 120/81 | HR 68 | Temp 98.1°F | Resp 20 | Ht 72.0 in | Wt 223.0 lb

## 2021-04-03 DIAGNOSIS — D122 Benign neoplasm of ascending colon: Secondary | ICD-10-CM | POA: Diagnosis not present

## 2021-04-03 DIAGNOSIS — Z86010 Personal history of colonic polyps: Secondary | ICD-10-CM | POA: Diagnosis not present

## 2021-04-03 DIAGNOSIS — Z803 Family history of malignant neoplasm of breast: Secondary | ICD-10-CM | POA: Diagnosis not present

## 2021-04-03 DIAGNOSIS — C187 Malignant neoplasm of sigmoid colon: Secondary | ICD-10-CM | POA: Diagnosis not present

## 2021-04-03 DIAGNOSIS — Z79899 Other long term (current) drug therapy: Secondary | ICD-10-CM | POA: Diagnosis not present

## 2021-04-03 DIAGNOSIS — Z8041 Family history of malignant neoplasm of ovary: Secondary | ICD-10-CM | POA: Diagnosis not present

## 2021-04-03 DIAGNOSIS — Z800 Family history of malignant neoplasm of digestive organs: Secondary | ICD-10-CM | POA: Diagnosis not present

## 2021-04-03 DIAGNOSIS — D125 Benign neoplasm of sigmoid colon: Secondary | ICD-10-CM | POA: Diagnosis not present

## 2021-04-03 NOTE — Progress Notes (Signed)
[facility] OFFICE PROGRESS NOTE   Diagnosis: Colon cancer  INTERVAL HISTORY:   Brian Avery returns as scheduled.  He feels well.  No change in bowel habits.  He has a good appetite.  Objective:  Vital signs in last 24 hours:  Blood pressure 120/81, pulse 68, temperature 98.1 F (36.7 C), temperature source Oral, resp. rate 20, height 6' (1.829 m), weight 223 lb (101.2 kg), SpO2 98 %.   Lymphatics: No palpable cervical, supraclavicular, axillary or inguinal lymph nodes. Resp: Lungs clear bilaterally. Cardio: Regular rate and rhythm. GI: Abdomen soft and nontender.  No hepatosplenomegaly. Vascular: No leg edema.   Lab Results:  Lab Results  Component Value Date   WBC 4.9 02/01/2020   HGB 16.0 02/01/2020   HCT 46.2 02/01/2020   MCV 108.2 (H) 02/01/2020   PLT 254 02/01/2020   NEUTROABS 2.1 02/01/2020    Imaging:  No results found.  Medications: I have reviewed the patient's current medications.  Assessment/Plan: Sigmoid colon cancer, stage II (T3N0) Colonoscopy 06/04/2019-cecum polyp, mass in the sigmoid colon beginning at 28 cm, biopsy confirmed a sessile serrated adenoma of the cecum, adenocarcinoma at the sigmoid colon CTs 06/05/2019-eccentric wall thickening of the proximal sigmoid colon with subcentimeter lymph nodes in the adjacent mesocolon, prominent nonspecific retroperitoneal nodes-largest 1 x 1 cm Robotic assisted sigmoidectomy 07/23/2019, well differentiated adenocarcinoma of the proximal sigmoid colon, tumor extends through the muscularis propria, positive lymphovascular invasion, negative resection margins, no loss of mismatch repair protein expression Cycle 1 adjuvant Xeloda 08/13/2019 Cycle 2 adjuvant Xeloda 09/03/2019, held on 09/15/2019 secondary to hand/foot pain and hives, resumed 09/16/2019 Cycle 3 adjuvant Xeloda 09/24/2019, dose reduction to 1500 mg twice daily secondary to hand/foot syndrome Cycle 4 adjuvant Xeloda 10/15/2019, 1500 mg twice  daily Cycle 5 adjuvant Xeloda 11/05/2019, 1500 mg twice daily Cycle 6 adjuvant Xeloda 11/26/2019, 2000 mg every morning, 1500 mg every evening for 14 days Cycle 7 adjuvant Xeloda 12/17/2019, 2000 mg every morning and 1500 mg every evening for 14 days Cycle 8 adjuvant Xeloda 01/07/2020, 2000 mg every morning and 1500 mg every evening CTs 04/03/2020-no evidence of metastatic disease, stable mildly enlarged left periaortic node Colonoscopy 06/23/2020-multiple polyps removed from the transverse and ascending colon, tubular adenoma and sessile serrated polyps, no high-grade dysplasia or malignancy CTs 04/01/2021-no evidence of metastatic disease Remote history of sarcoidosis-pulmonary and skin, he reports treatment with prednisone with resolution Family history of multiple cancers including breast, ovarian, and a "gastrointestinal" malignancy Custom next-Cancer panel-BARD1 VUS      Disposition: Brian Avery remains in remission from colon cancer.  Recent surveillance CT scans showed no evidence of metastatic disease.  CEA remains in normal range.  He will return for a CEA and office visit in 6 months.    Ned Card ANP/GNP-BC   04/03/2021  9:30 AM

## 2021-04-08 ENCOUNTER — Other Ambulatory Visit: Payer: 59

## 2021-04-08 ENCOUNTER — Ambulatory Visit (HOSPITAL_BASED_OUTPATIENT_CLINIC_OR_DEPARTMENT_OTHER): Payer: 59

## 2021-04-10 ENCOUNTER — Ambulatory Visit: Payer: 59 | Admitting: Nurse Practitioner

## 2021-04-15 ENCOUNTER — Encounter: Payer: Self-pay | Admitting: Gastroenterology

## 2021-06-08 ENCOUNTER — Encounter: Payer: Self-pay | Admitting: Gastroenterology

## 2021-06-08 ENCOUNTER — Other Ambulatory Visit (HOSPITAL_BASED_OUTPATIENT_CLINIC_OR_DEPARTMENT_OTHER): Payer: Self-pay

## 2021-06-08 ENCOUNTER — Ambulatory Visit (AMBULATORY_SURGERY_CENTER): Payer: 59 | Admitting: *Deleted

## 2021-06-08 VITALS — Ht 72.0 in | Wt 202.0 lb

## 2021-06-08 DIAGNOSIS — Z86010 Personal history of colonic polyps: Secondary | ICD-10-CM

## 2021-06-08 DIAGNOSIS — Z85038 Personal history of other malignant neoplasm of large intestine: Secondary | ICD-10-CM

## 2021-06-08 MED ORDER — NA SULFATE-K SULFATE-MG SULF 17.5-3.13-1.6 GM/177ML PO SOLN
1.0000 | Freq: Once | ORAL | 0 refills | Status: AC
  Filled 2021-06-08 – 2021-07-16 (×2): qty 354, 1d supply, fill #0

## 2021-06-08 NOTE — Progress Notes (Signed)

## 2021-06-18 ENCOUNTER — Other Ambulatory Visit (HOSPITAL_BASED_OUTPATIENT_CLINIC_OR_DEPARTMENT_OTHER): Payer: Self-pay

## 2021-06-29 ENCOUNTER — Encounter: Payer: 59 | Admitting: Gastroenterology

## 2021-07-16 ENCOUNTER — Other Ambulatory Visit (HOSPITAL_BASED_OUTPATIENT_CLINIC_OR_DEPARTMENT_OTHER): Payer: Self-pay

## 2021-07-27 ENCOUNTER — Ambulatory Visit (AMBULATORY_SURGERY_CENTER): Payer: 59 | Admitting: Gastroenterology

## 2021-07-27 ENCOUNTER — Encounter: Payer: Self-pay | Admitting: Gastroenterology

## 2021-07-27 VITALS — BP 95/56 | HR 51 | Temp 97.5°F | Resp 15 | Ht 72.0 in | Wt 202.0 lb

## 2021-07-27 DIAGNOSIS — Z85038 Personal history of other malignant neoplasm of large intestine: Secondary | ICD-10-CM | POA: Diagnosis not present

## 2021-07-27 DIAGNOSIS — Z09 Encounter for follow-up examination after completed treatment for conditions other than malignant neoplasm: Secondary | ICD-10-CM | POA: Diagnosis not present

## 2021-07-27 DIAGNOSIS — D122 Benign neoplasm of ascending colon: Secondary | ICD-10-CM | POA: Diagnosis not present

## 2021-07-27 DIAGNOSIS — Z86010 Personal history of colonic polyps: Secondary | ICD-10-CM | POA: Diagnosis not present

## 2021-07-27 DIAGNOSIS — K635 Polyp of colon: Secondary | ICD-10-CM | POA: Diagnosis not present

## 2021-07-27 DIAGNOSIS — Z1211 Encounter for screening for malignant neoplasm of colon: Secondary | ICD-10-CM | POA: Diagnosis not present

## 2021-07-27 MED ORDER — SODIUM CHLORIDE 0.9 % IV SOLN: 500.0000 mL | Freq: Once | INTRAVENOUS | Status: DC

## 2021-07-27 NOTE — Progress Notes (Signed)
[facility] History and Physical   Primary Care Physician:  Janie Morning, DO   Reason for Procedure:  Personal history of colon cancer  Plan:    Surveillance colonoscopy with possible interventions as needed     HPI: Brian Avery is a very pleasant 56 y.o. male here for surveillance colonoscopy. Denies any nausea, vomiting, abdominal pain, melena or bright red blood per rectum  The risks and benefits as well as alternatives of endoscopic procedure(s) have been discussed and reviewed. All questions answered. The patient agrees to proceed.    Past Medical History:  Diagnosis Date   Allergy    Arthritis    Cancer ([facility])    colon cancer 4-19- 2021- surgery 07-2019- pill chemo - no iv chemo, no radiation    Family history of breast cancer    Family history of colon cancer    Family history of ovarian cancer    GERD (gastroesophageal reflux disease)    mild    Sarcoidosis of lung ([place]) 1998    Past Surgical History:  Procedure Laterality Date   COLON SURGERY     colectomy 07-2019   COLONOSCOPY     FLEXIBLE SIGMOIDOSCOPY N/A 07/23/2019   Procedure: FLEXIBLE SIGMOIDOSCOPY;  Surgeon: Ileana Roup, MD;  Location: [facility];  Service: General;  Laterality: N/A;   KNEE ARTHROSCOPY  2010   right   KNEE ARTHROSCOPY Right 01/30/2016   Procedure: ARTHROSCOPY KNEE;  Surgeon: Netta Cedars, MD;  Location: [facility];  Service: Orthopedics;  Laterality: Right;   NECK SURGERY     lymphnode removed   POLYPECTOMY      Prior to Admission medications   Medication Sig Start Date End Date Taking? Authorizing Provider  Ascorbic Acid (VITAMIN C PO) Take 1 tablet by mouth daily. Does not know dosage    [provider]  VITAMIN D, ERGOCALCIFEROL, PO Take 3,000 Units by mouth daily.    [provider]  zinc gluconate 50 MG tablet Take 50 mg by mouth daily.    [provider]    Current Outpatient Medications  Medication Sig Dispense Refill   Ascorbic  Acid (VITAMIN C PO) Take 1 tablet by mouth daily. Does not know dosage     VITAMIN D, ERGOCALCIFEROL, PO Take 3,000 Units by mouth daily.     zinc gluconate 50 MG tablet Take 50 mg by mouth daily.     Current Facility-Administered Medications  Medication Dose Route Frequency Provider Last Rate Last Admin   0.9 %  sodium chloride infusion  500 mL Intravenous Once Mauri Pole, MD        Allergies as of 07/27/2021   (No Known Allergies)    Family History  Problem Relation Age of Onset   Breast cancer Mother 69       second dx at 54   Factor V Leiden deficiency Mother    Colon polyps Mother    Hypertension Father    Colon polyps Father    Colon cancer Maternal Grandfather 60       d. 33   Ovarian cancer Sister 70   Prostate cancer Maternal Uncle 55       d. 66   Dementia Paternal Uncle    Heart disease Maternal Grandmother    Lung cancer Paternal Grandfather    Colon cancer Maternal Uncle 82   Factor V Leiden deficiency Maternal Uncle    Thrombosis Maternal Uncle        due to FVL  Esophageal cancer Neg Hx    Stomach cancer Neg Hx    Rectal cancer Neg Hx     Social History   Socioeconomic History   Marital status: Married    Spouse name: Not on file   Number of children: Not on file   Years of education: Not on file   Highest education level: Not on file  Occupational History   Not on file  Tobacco Use   Smoking status: Never   Smokeless tobacco: Former  Scientific laboratory technician Use: Never used  Substance and Sexual Activity   Alcohol use: Not Currently   Drug use: No   Sexual activity: Not on file  Other Topics Concern   Not on file  Social History Narrative   Not on file   Social Determinants of Health   Financial Resource Strain: Not on file  Food Insecurity: Not on file  Transportation Needs: Not on file  Physical Activity: Not on file  Stress: Not on file  Social Connections: Not on file  Intimate Partner Violence: Not on file    Review of  Systems:  All other review of systems negative except as mentioned in the HPI.  Physical Exam: Vital signs in last 24 hours: BP 115/70   Pulse 64   Temp (!) 97.5 F (36.4 C)   Ht 6' (1.829 m)   Wt 202 lb (91.6 kg)   SpO2 98%   BMI 27.40 kg/m  General:   Alert, NAD Lungs:  Clear .   Heart:  Regular rate and rhythm Abdomen:  Soft, nontender and nondistended. Neuro/Psych:  Alert and cooperative. Normal mood and affect. A and O x 3  Reviewed labs, radiology imaging, old records and pertinent past GI work up  Patient is appropriate for planned procedure(s) and anesthesia in an ambulatory setting   K. Denzil Magnuson , MD [phone]

## 2021-07-27 NOTE — Op Note (Signed)
[facility] Patient Name: Brian Avery Procedure Date: 07/27/2021 3:08 PM MRN: [id number] Endoscopist: Mauri Pole , MD Age: 56 Referring MD:  Date of Birth: Oct 24, 1965 Gender: Male Account #: [id number] Procedure:                Colonoscopy Indications:              High risk colon cancer surveillance: Personal                            history of colon cancer Medicines:                Monitored Anesthesia Care Procedure:                Pre-Anesthesia Assessment:                           - Prior to the procedure, a History and Physical                            was performed, and patient medications and                            allergies were reviewed. The patient's tolerance of                            previous anesthesia was also reviewed. The risks                            and benefits of the procedure and the sedation                            options and risks were discussed with the patient.                            All questions were answered, and informed consent                            was obtained. Prior Anticoagulants: The patient has                            taken no previous anticoagulant or antiplatelet                            agents. ASA Grade Assessment: II - A patient with                            mild systemic disease. After reviewing the risks                            and benefits, the patient was deemed in                            satisfactory condition to undergo the procedure.  After obtaining informed consent, the colonoscope                            was passed under direct vision. Throughout the                            procedure, the patient's blood pressure, pulse, and                            oxygen saturations were monitored continuously. The                            Olympus PCF-H190DL (#[id number]) Colonoscope was                            introduced through the anus and advanced  to the the                            cecum, identified by appendiceal orifice and                            ileocecal valve. The colonoscopy was performed                            without difficulty. The patient tolerated the                            procedure well. The quality of the bowel                            preparation was adequate to identify polyps 6 mm                            and larger in size. The ileocecal valve,                            appendiceal orifice, and rectum were photographed. Scope In: 3:24:47 PM Scope Out: 3:37:59 PM Scope Withdrawal Time: 0 hours 10 minutes 16 seconds  Total Procedure Duration: 0 hours 13 minutes 12 seconds  Findings:                 The perianal and digital rectal examinations were                            normal.                           A 7 mm polyp was found in the ascending colon. The                            polyp was sessile. The polyp was removed with a                            cold snare. Resection and retrieval were complete.  Semi-liquid stool was found in the ascending colon                            and in the cecum, interfering with visualization.                            Lavage of the area was performed, resulting in                            clearance with fair visualization. Could have                            missed flat lesions or small polyps                           There was evidence of a prior end-to-end                            colo-colonic anastomosis in the recto-sigmoid                            colon. This was patent and was characterized by                            healthy appearing mucosa.                           Non-bleeding external and internal hemorrhoids were                            found during retroflexion. The hemorrhoids were                            small.                           The exam was otherwise without abnormality. Complications:             No immediate complications. Estimated Blood Loss:     Estimated blood loss was minimal. Impression:               - One 7 mm polyp in the ascending colon, removed                            with a cold snare. Resected and retrieved.                           - Stool in the ascending colon and in the cecum.                           - Patent end-to-end colo-colonic anastomosis,                            characterized by healthy appearing mucosa.                           -  Non-bleeding external and internal hemorrhoids.                           - The examination was otherwise normal. Recommendation:           - Patient has a contact number available for                            emergencies. The signs and symptoms of potential                            delayed complications were discussed with the                            patient. Return to normal activities tomorrow.                            Written discharge instructions were provided to the                            patient.                           - Resume previous diet.                           - Continue present medications.                           - Await pathology results.                           - Repeat colonoscopy in 1 year because the bowel                            preparation was suboptimal. Mauri Pole, MD 07/27/2021 3:45:15 PM This report has been signed electronically.

## 2021-07-27 NOTE — Progress Notes (Signed)
Sedate, gd SR, tolerated procedure well, VSS, report to RN 

## 2021-07-27 NOTE — Patient Instructions (Signed)
   HANDOUTS ON POLYPS AND HEMORRHOIDS GIVEN.  YOU HAD AN ENDOSCOPIC PROCEDURE TODAY AT [facility]:   Refer to the procedure report that was given to you for any specific questions about what was found during the examination.  If the procedure report does not answer your questions, please call your gastroenterologist to clarify.  If you requested that your care partner not be given the details of your procedure findings, then the procedure report has been included in a sealed envelope for you to review at your convenience later.  YOU SHOULD EXPECT: Some feelings of bloating in the abdomen. Passage of more gas than usual.  Walking can help get rid of the air that was put into your GI tract during the procedure and reduce the bloating. If you had a lower endoscopy (such as a colonoscopy or flexible sigmoidoscopy) you may notice spotting of blood in your stool or on the toilet paper. If you underwent a bowel prep for your procedure, you may not have a normal bowel movement for a few days.  Please Note:  You might notice some irritation and congestion in your nose or some drainage.  This is from the oxygen used during your procedure.  There is no need for concern and it should clear up in a day or so.  SYMPTOMS TO REPORT IMMEDIATELY:  Following lower endoscopy (colonoscopy or flexible sigmoidoscopy):  Excessive amounts of blood in the stool  Significant tenderness or worsening of abdominal pains  Swelling of the abdomen that is new, acute  Fever of 100F or higher   For urgent or emergent issues, a gastroenterologist can be reached at any hour by calling [phone]. Do not use MyChart messaging for urgent concerns.    DIET:  We do recommend a small meal at first, but then you may proceed to your regular diet.  Drink plenty of fluids but you should avoid alcoholic beverages for 24 hours.  ACTIVITY:  You should plan to take it easy for the rest of today and you should NOT DRIVE  or use heavy machinery until tomorrow (because of the sedation medicines used during the test).    FOLLOW UP: Our staff will call the number listed on your records 24-72 hours following your procedure to check on you and address any questions or concerns that you may have regarding the information given to you following your procedure. If we do not reach you, we will leave a message.  We will attempt to reach you two times.  During this call, we will ask if you have developed any symptoms of COVID 19. If you develop any symptoms (ie: fever, flu-like symptoms, shortness of breath, cough etc.) before then, please call [phone].  If you test positive for Covid 19 in the 2 weeks post procedure, please call and report this information to [place].    If any biopsies were taken you will be contacted by phone or by letter within the next 1-3 weeks.  Please call us at [phone] if you have not heard about the biopsies in 3 weeks.    SIGNATURES/CONFIDENTIALITY: You and/or your care partner have signed paperwork which will be entered into your electronic medical record.  These signatures attest to the fact that that the information above on your After Visit Summary has been reviewed and is understood.  Full responsibility of the confidentiality of this discharge information lies with you and/or your care-partner.

## 2021-07-27 NOTE — Progress Notes (Signed)
Pt's states no medical or surgical changes since previsit or office visit. 

## 2021-07-27 NOTE — Progress Notes (Signed)
Called to room to assist during endoscopic procedure.  Patient ID and intended procedure confirmed with present staff. Received instructions for my participation in the procedure from the performing physician.  

## 2021-07-28 ENCOUNTER — Telehealth: Payer: Self-pay

## 2021-07-28 ENCOUNTER — Telehealth: Payer: Self-pay | Admitting: *Deleted

## 2021-07-28 NOTE — Telephone Encounter (Signed)
No answer on first attempt follow up call. Left message.  ?

## 2021-07-28 NOTE — Telephone Encounter (Signed)
Left message on follow up call. 

## 2021-08-05 ENCOUNTER — Other Ambulatory Visit (HOSPITAL_BASED_OUTPATIENT_CLINIC_OR_DEPARTMENT_OTHER): Payer: Self-pay

## 2021-08-06 ENCOUNTER — Encounter: Payer: Self-pay | Admitting: Gastroenterology

## 2021-08-16 DIAGNOSIS — M20012 Mallet finger of left finger(s): Secondary | ICD-10-CM | POA: Diagnosis not present

## 2021-08-16 DIAGNOSIS — W228XXA Striking against or struck by other objects, initial encounter: Secondary | ICD-10-CM | POA: Diagnosis not present

## 2021-09-03 DIAGNOSIS — M20012 Mallet finger of left finger(s): Secondary | ICD-10-CM | POA: Diagnosis not present

## 2021-09-03 DIAGNOSIS — M25522 Pain in left elbow: Secondary | ICD-10-CM | POA: Diagnosis not present

## 2021-09-03 DIAGNOSIS — M79645 Pain in left finger(s): Secondary | ICD-10-CM | POA: Diagnosis not present

## 2021-09-15 DIAGNOSIS — M79645 Pain in left finger(s): Secondary | ICD-10-CM | POA: Diagnosis not present

## 2021-10-01 ENCOUNTER — Inpatient Hospital Stay: Payer: 59

## 2021-10-01 ENCOUNTER — Inpatient Hospital Stay: Payer: 59 | Admitting: Oncology

## 2021-10-04 IMAGING — CT CT ABD-PELV W/ CM
2 of 5 series · 14 of 46 positions shown, 16 images · IV contrast (omnipaque)
Comparison: None.

CLINICAL DATA: Newly diagnosed sigmoid colon cancer

EXAM:
CT CHEST, ABDOMEN, AND PELVIS WITH CONTRAST
TECHNIQUE: Multidetector CT imaging of the chest, abdomen and pelvis was
performed following the standard protocol during bolus
administration of intravenous contrast.
CONTRAST:  100mL OMNIPAQUE IOHEXOL 300 MG/ML SOLN, additional oral
enteric contrast

[Series 2: cap with · axial · 0.75mm/px · z∈[-666,-96]mm · 11 of 138 slices shown, 13 images]
[im 12/138  soft-tissue]
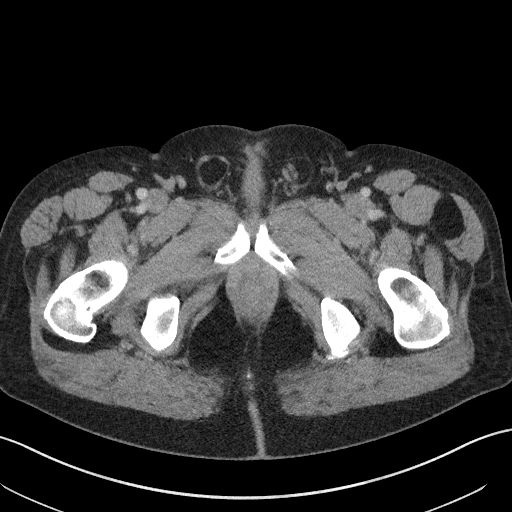
[im 12/138  bone]
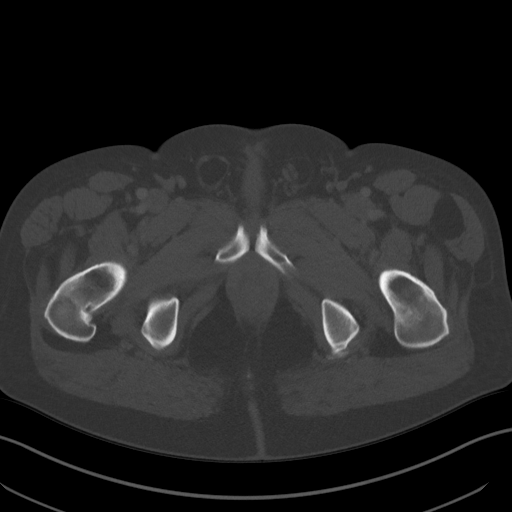
[im 23/138  soft-tissue]
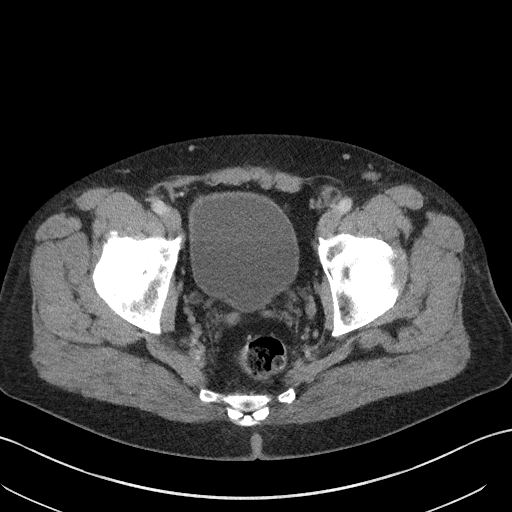
[im 35/138  soft-tissue]
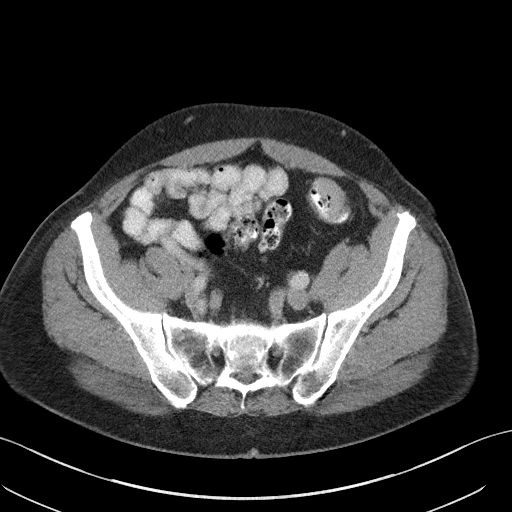
[im 46/138  soft-tissue]
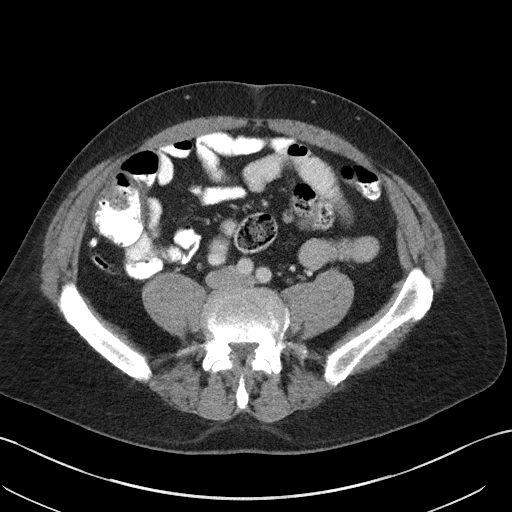
[im 58/138  soft-tissue]
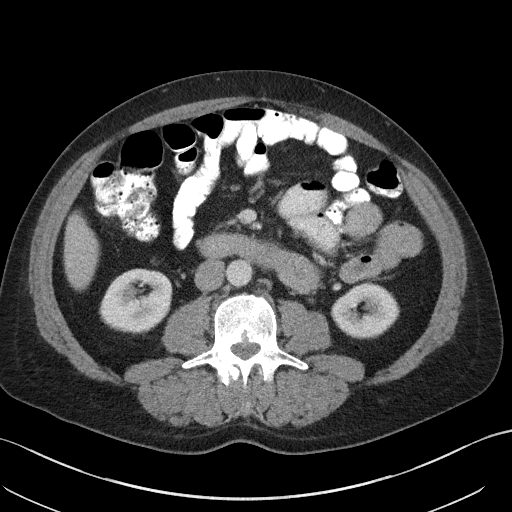
[im 69/138  soft-tissue]
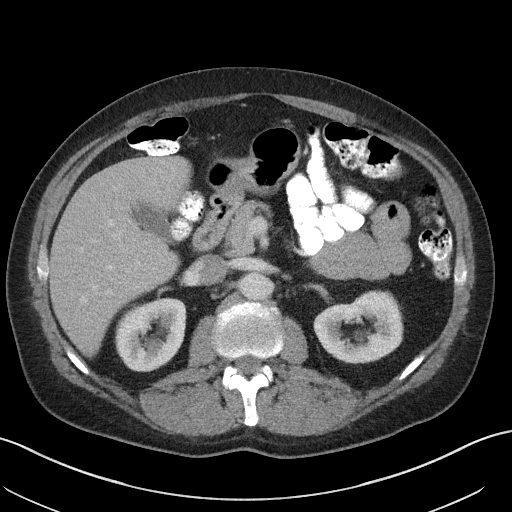
[im 80/138  soft-tissue]
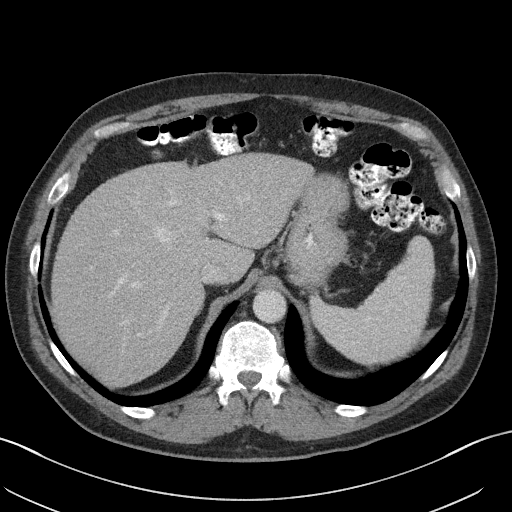
[im 92/138  soft-tissue]
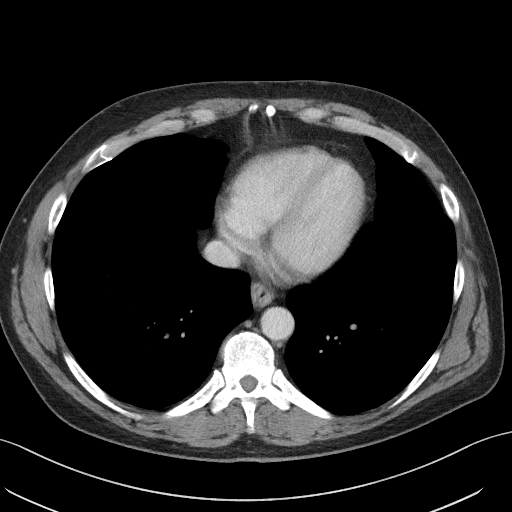
[im 103/138  soft-tissue]
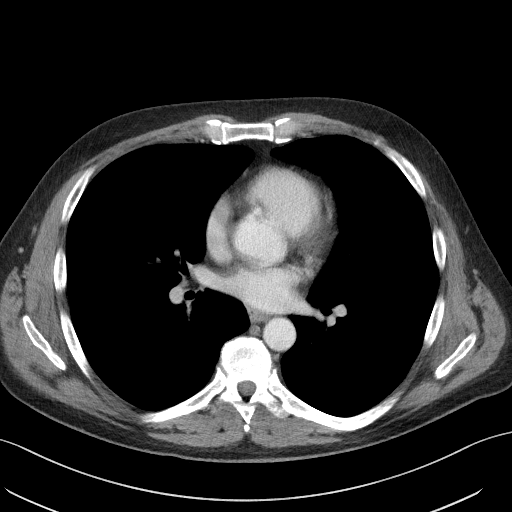
[im 103/138  bone]
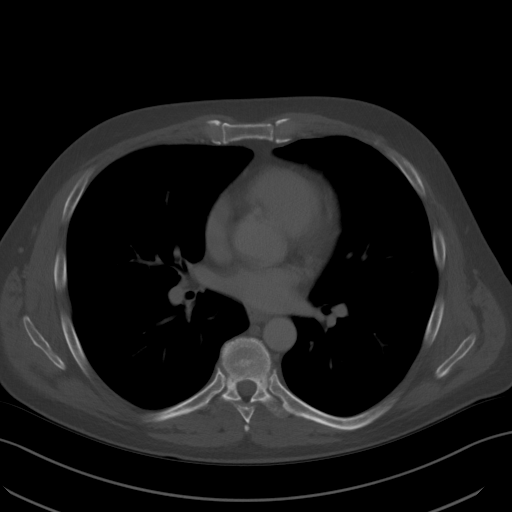
[im 115/138  soft-tissue]
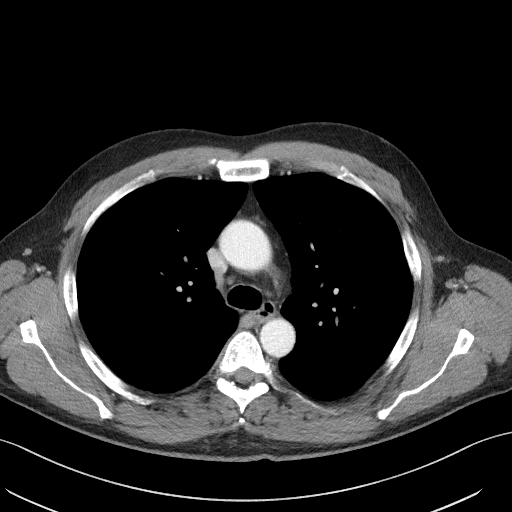
[im 126/138  soft-tissue]
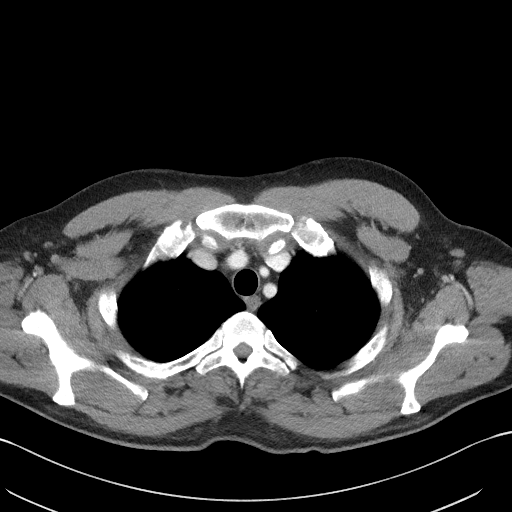

[Series 5: coronal · coronal · 0.80mm/px · 3 of 151 slices shown]
[im 51/151  soft-tissue]
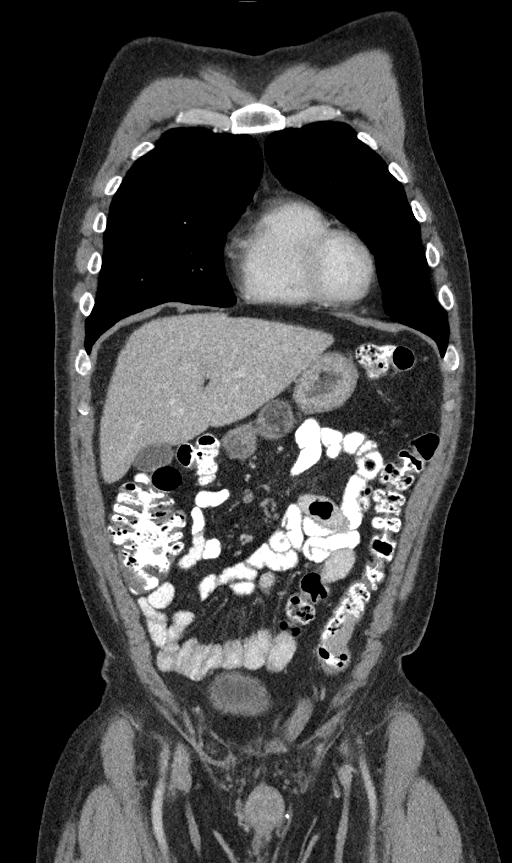
[im 67/151  soft-tissue]
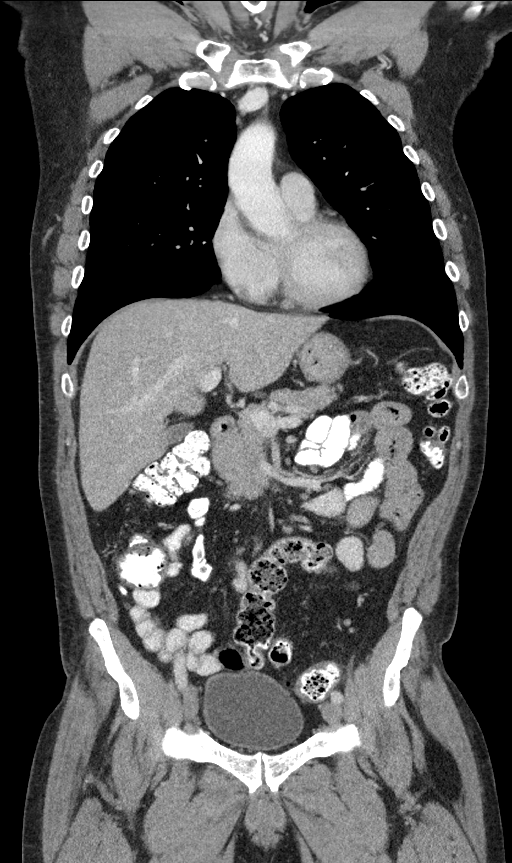
[im 84/151  soft-tissue]
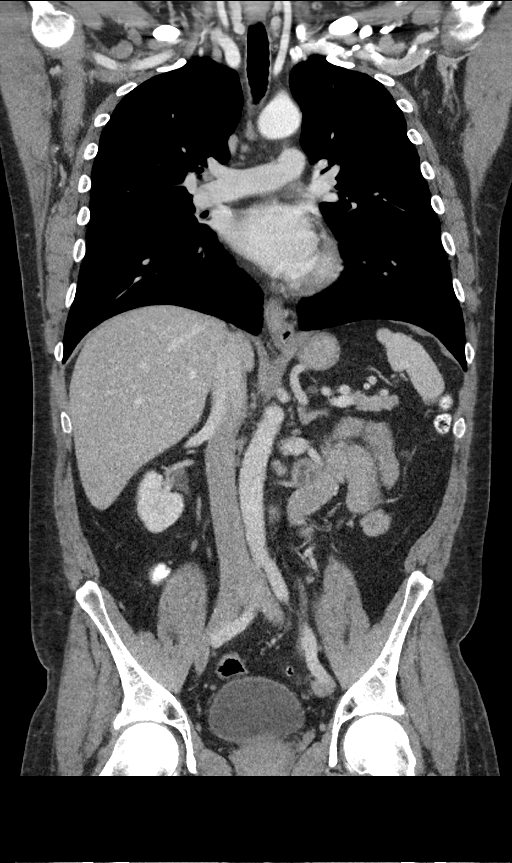

[14 of 46 positions shown; findings below may reference images not displayed]

FINDINGS: CT CHEST FINDINGS

Cardiovascular: No significant vascular findings. Normal heart size.
No pericardial effusion.

Mediastinum/Nodes: No enlarged mediastinal, hilar, or axillary lymph
nodes. Thyroid gland, trachea, and esophagus demonstrate no
significant findings.

Lungs/Pleura: Lungs are clear. No pleural effusion or pneumothorax.

Musculoskeletal: No chest wall mass or suspicious bone lesions
identified.

CT ABDOMEN PELVIS FINDINGS

Hepatobiliary: No solid liver abnormality is seen. Hepatic
steatosis. No gallstones, gallbladder wall thickening, or biliary
dilatation.

Pancreas: Unremarkable. No pancreatic ductal dilatation or
surrounding inflammatory changes.

Spleen: Normal in size without significant abnormality.

Adrenals/Urinary Tract: Adrenal glands are unremarkable. Kidneys are
normal, without renal calculi, solid lesion, or hydronephrosis.
Bladder is unremarkable.

Stomach/Bowel: Stomach is within normal limits. Appendix appears
normal. Left eccentric wall thickening of the proximal sigmoid colon
(series 2, image 23). Diverticulosis of the redundant sigmoid.

Vascular/Lymphatic: No significant vascular findings are present.
There are prominent subcentimeter lymph nodes in the adjacent
mesocolon measuring up to 5 mm (series 2, image 100). Prominent,
nonspecific retroperitoneal lymph nodes, largest left
retroperitoneal nodes measuring approximately 1.0 x 1.0 cm (series
2, image 74)

Reproductive: No mass or other abnormality.

Other: No abdominal wall hernia or abnormality. No abdominopelvic
ascites.

Musculoskeletal: No acute or significant osseous findings.
IMPRESSION: 1. Left eccentric wall thickening of the proximal sigmoid colon, in
keeping with reported mass. There are prominent subcentimeter lymph
nodes in the adjacent mesocolon measuring up to 5 mm, concerning for
nodal metastases.

2. Prominent, nonspecific retroperitoneal lymph nodes, largest left
retroperitoneal nodes measuring approximately 1.0 x 1.0 cm, somewhat
concerning for nodal metastatic disease.

3. No other evidence of metastatic disease in the chest, abdomen, or
pelvis.

4.  Hepatic steatosis.

## 2021-11-12 ENCOUNTER — Inpatient Hospital Stay: Payer: 59

## 2021-11-12 ENCOUNTER — Inpatient Hospital Stay: Payer: 59 | Attending: Oncology | Admitting: Oncology

## 2021-11-12 VITALS — BP 134/84 | HR 66 | Temp 98.2°F | Resp 18 | Ht 72.0 in | Wt 220.2 lb

## 2021-11-12 DIAGNOSIS — C187 Malignant neoplasm of sigmoid colon: Secondary | ICD-10-CM

## 2021-11-12 LAB — CEA (ACCESS): CEA (CHCC): 1.17 ng/mL (ref 0.00–5.00)

## 2021-11-12 NOTE — Progress Notes (Signed)
[facility] OFFICE PROGRESS NOTE   Diagnosis: Colon cancer  INTERVAL HISTORY:   [name] returns as scheduled.  He feels well.  Good appetite.  No difficulty with bowel function.  No bleeding.  He underwent a colonoscopy 07/27/2021 the polyp was removed from the ascending colon.  Objective:  Vital signs in last 24 hours:  Blood pressure 134/84, pulse 66, temperature 98.2 F (36.8 C), temperature source Oral, resp. rate 18, height 6' (1.829 m), weight 220 lb 3.2 oz (99.9 kg), SpO2 98 %.    Lymphatics: No cervical, supraclavicular, axillary, or inguinal nodes Resp: Lungs clear bilaterally Cardio: Regular rate and rhythm GI: No hepatosplenomegaly, no mass, nontender, soft small right inguinal hernia Vascular: No leg edema  Lab Results:  Lab Results  Component Value Date   WBC 4.9 02/01/2020   HGB 16.0 02/01/2020   HCT 46.2 02/01/2020   MCV 108.2 (H) 02/01/2020   PLT 254 02/01/2020   NEUTROABS 2.1 02/01/2020    CMP  Lab Results  Component Value Date   NA 138 04/01/2021   K 4.6 04/01/2021   CL 102 04/01/2021   CO2 28 04/01/2021   GLUCOSE 92 04/01/2021   BUN 18 04/01/2021   CREATININE 0.98 04/01/2021   CALCIUM 9.0 04/01/2021   PROT 7.4 02/01/2020   ALBUMIN 4.5 02/01/2020   AST 38 02/01/2020   ALT 25 02/01/2020   ALKPHOS 45 02/01/2020   BILITOT 0.6 02/01/2020   GFRNONAA >60 04/01/2021   GFRAA >60 10/31/2019    Lab Results  Component Value Date   CEA1 1.69 10/06/2020   CEA 1.17 11/12/2021    Medications: I have reviewed the patient's current medications.   Assessment/Plan: Sigmoid colon cancer, stage II (T3N0) Colonoscopy 06/04/2019-cecum polyp, mass in the sigmoid colon beginning at 28 cm, biopsy confirmed a sessile serrated adenoma of the cecum, adenocarcinoma at the sigmoid colon CTs 06/05/2019-eccentric wall thickening of the proximal sigmoid colon with subcentimeter lymph nodes in the adjacent mesocolon, prominent nonspecific  retroperitoneal nodes-largest 1 x 1 cm Robotic assisted sigmoidectomy 07/23/2019, well differentiated adenocarcinoma of the proximal sigmoid colon, tumor extends through the muscularis propria, positive lymphovascular invasion, negative resection margins, no loss of mismatch repair protein expression Cycle 1 adjuvant Xeloda 08/13/2019 Cycle 2 adjuvant Xeloda 09/03/2019, held on 09/15/2019 secondary to hand/foot pain and hives, resumed 09/16/2019 Cycle 3 adjuvant Xeloda 09/24/2019, dose reduction to 1500 mg twice daily secondary to hand/foot syndrome Cycle 4 adjuvant Xeloda 10/15/2019, 1500 mg twice daily Cycle 5 adjuvant Xeloda 11/05/2019, 1500 mg twice daily Cycle 6 adjuvant Xeloda 11/26/2019, 2000 mg every morning, 1500 mg every evening for 14 days Cycle 7 adjuvant Xeloda 12/17/2019, 2000 mg every morning and 1500 mg every evening for 14 days Cycle 8 adjuvant Xeloda 01/07/2020, 2000 mg every morning and 1500 mg every evening CTs 04/03/2020-no evidence of metastatic disease, stable mildly enlarged left periaortic node Colonoscopy 06/23/2020-multiple polyps removed from the transverse and ascending colon, tubular adenoma and sessile serrated polyps, no high-grade dysplasia or malignancy CTs 04/01/2021-no evidence of metastatic disease Colonoscopy 07/27/2021-polyps removed from the ascending colon-sessile serrated polyp Remote history of sarcoidosis-pulmonary and skin, he reports treatment with prednisone with resolution Family history of multiple cancers including breast, ovarian, and a "gastrointestinal" malignancy Custom next-Cancer panel-BARD1 VUS       Disposition: Mr Brian Avery is in clinical remission from colon cancer.  He will return for an office visit, CEA, and surveillance CTs in 6 months.  He will continue colonoscopy surveillance with Dr. Nandigam.    Betsy Coder, MD  11/12/2021  9:27 AM

## 2022-05-10 ENCOUNTER — Encounter (HOSPITAL_BASED_OUTPATIENT_CLINIC_OR_DEPARTMENT_OTHER): Payer: Self-pay

## 2022-05-10 ENCOUNTER — Ambulatory Visit (HOSPITAL_BASED_OUTPATIENT_CLINIC_OR_DEPARTMENT_OTHER)
Admission: RE | Admit: 2022-05-10 | Discharge: 2022-05-10 | Disposition: A | Discharge status: Home / Self Care | Payer: 59 | Source: Ambulatory Visit | Attending: Oncology | Admitting: Oncology

## 2022-05-10 ENCOUNTER — Inpatient Hospital Stay: Payer: 59 | Attending: Oncology

## 2022-05-10 DIAGNOSIS — Z85038 Personal history of other malignant neoplasm of large intestine: Secondary | ICD-10-CM | POA: Insufficient documentation

## 2022-05-10 DIAGNOSIS — C189 Malignant neoplasm of colon, unspecified: Secondary | ICD-10-CM | POA: Diagnosis not present

## 2022-05-10 DIAGNOSIS — C187 Malignant neoplasm of sigmoid colon: Secondary | ICD-10-CM | POA: Insufficient documentation

## 2022-05-10 LAB — BASIC METABOLIC PANEL - CANCER CENTER ONLY
Anion gap: 5 (ref 5–15)
BUN: 24 mg/dL — ABNORMAL HIGH (ref 6–20)
CO2: 29 mmol/L (ref 22–32)
Calcium: 9.5 mg/dL (ref 8.9–10.3)
Chloride: 100 mmol/L (ref 98–111)
Creatinine: 1.07 mg/dL (ref 0.61–1.24)
GFR, Estimated: >60 mL/min (ref >60)
Glucose, Bld: 94 mg/dL (ref 70–99)
Potassium: 4.2 mmol/L (ref 3.5–5.1)
Sodium: 134 mmol/L — ABNORMAL LOW (ref 135–145)

## 2022-05-10 LAB — CEA (ACCESS): CEA (CHCC): <1 ng/mL (ref 0.00–5.00)

## 2022-05-10 MED ORDER — IOHEXOL 300 MG/ML  SOLN
100.0000 mL | Freq: Once | INTRAMUSCULAR | Status: AC | PRN
  Administered 2022-05-10: 85 mL via INTRAVENOUS

## 2022-05-13 LAB — POCT I-STAT CREATININE: Creatinine, Ser: 1.1 mg/dL (ref 0.61–1.24)

## 2022-05-17 ENCOUNTER — Inpatient Hospital Stay: Payer: 59 | Attending: Oncology | Admitting: Oncology

## 2022-05-17 VITALS — BP 132/82 | HR 73 | Temp 98.2°F | Resp 18 | Ht 72.0 in | Wt 211.6 lb

## 2022-05-17 DIAGNOSIS — C187 Malignant neoplasm of sigmoid colon: Secondary | ICD-10-CM | POA: Diagnosis not present

## 2022-05-17 DIAGNOSIS — Z9221 Personal history of antineoplastic chemotherapy: Secondary | ICD-10-CM | POA: Diagnosis not present

## 2022-05-17 DIAGNOSIS — K4090 Unilateral inguinal hernia, without obstruction or gangrene, not specified as recurrent: Secondary | ICD-10-CM | POA: Insufficient documentation

## 2022-05-17 DIAGNOSIS — Z85038 Personal history of other malignant neoplasm of large intestine: Secondary | ICD-10-CM | POA: Diagnosis not present

## 2022-05-17 NOTE — Progress Notes (Signed)
Brian Avery   Diagnosis: Colon cancer  INTERVAL HISTORY:   Brian Avery returns as scheduled.  He feels well.  He reports intentional weight loss with diet and exercise.  He plans to schedule a follow-up colonoscopy for within the next few months.  A right inguinal hernia persists.  Objective:  Vital signs in last 24 hours:  Blood pressure 132/82, pulse 73, temperature 98.2 F (36.8 C), temperature source Oral, resp. rate 18, height 6' (1.829 m), weight 211 lb 9.6 oz (96 kg), SpO2 98 %.    Lymphatics: No cervical, supraclavicular, axillary, or inguinal nodes Resp: Lungs clear bilaterally Cardio: Regular rate and rhythm GI: No mass, no hepatosplenomegaly, nontender, reducible right inguinal hernia Vascular: No leg edema   Lab Results:  Lab Results  Component Value Date   WBC 4.9 02/01/2020   HGB 16.0 02/01/2020   HCT 46.2 02/01/2020   MCV 108.2 (H) 02/01/2020   PLT 254 02/01/2020   NEUTROABS 2.1 02/01/2020    CMP  Lab Results  Component Value Date   NA 134 (L) 05/10/2022   K 4.2 05/10/2022   CL 100 05/10/2022   CO2 29 05/10/2022   GLUCOSE 94 05/10/2022   BUN 24 (H) 05/10/2022   CREATININE 1.07 05/10/2022   CALCIUM 9.5 05/10/2022   PROT 7.4 02/01/2020   ALBUMIN 4.5 02/01/2020   AST 38 02/01/2020   ALT 25 02/01/2020   ALKPHOS 45 02/01/2020   BILITOT 0.6 02/01/2020   GFRNONAA >60 05/10/2022   GFRAA >60 10/31/2019    Lab Results  Component Value Date   CEA1 1.69 10/06/2020   CEA <1.00 05/10/2022    Medications: I have reviewed the patient's current medications.   Assessment/Plan: Sigmoid colon cancer, stage II (T3N0) Colonoscopy 06/04/2019-cecum polyp, mass in the sigmoid colon beginning at 28 cm, biopsy confirmed a sessile serrated adenoma of the cecum, adenocarcinoma at the sigmoid colon CTs 06/05/2019-eccentric wall thickening of the proximal sigmoid colon with subcentimeter lymph nodes in the adjacent mesocolon,  prominent nonspecific retroperitoneal nodes-largest 1 x 1 cm Robotic assisted sigmoidectomy 07/23/2019, well differentiated adenocarcinoma of the proximal sigmoid colon, tumor extends through the muscularis propria, positive lymphovascular invasion, negative resection margins, no loss of mismatch repair protein expression Cycle 1 adjuvant Xeloda 08/13/2019 Cycle 2 adjuvant Xeloda 09/03/2019, held on 09/15/2019 secondary to hand/foot pain and hives, resumed 09/16/2019 Cycle 3 adjuvant Xeloda 09/24/2019, dose reduction to 1500 mg twice daily secondary to hand/foot syndrome Cycle 4 adjuvant Xeloda 10/15/2019, 1500 mg twice daily Cycle 5 adjuvant Xeloda 11/05/2019, 1500 mg twice daily Cycle 6 adjuvant Xeloda 11/26/2019, 2000 mg every morning, 1500 mg every evening for 14 days Cycle 7 adjuvant Xeloda 12/17/2019, 2000 mg every morning and 1500 mg every evening for 14 days Cycle 8 adjuvant Xeloda 01/07/2020, 2000 mg every morning and 1500 mg every evening CTs 04/03/2020-no evidence of metastatic disease, stable mildly enlarged left periaortic node Colonoscopy 06/23/2020-multiple polyps removed from the transverse and ascending colon, tubular adenoma and sessile serrated polyps, no high-grade dysplasia or malignancy CTs 04/01/2021-no evidence of metastatic disease Colonoscopy 07/27/2021-polyps removed from the ascending colon-sessile serrated polyp CT 05/10/2022-no evidence of recurrent disease Remote history of sarcoidosis-pulmonary and skin, he reports treatment with prednisone with resolution Family history of multiple cancers including breast, ovarian, and a "gastrointestinal" malignancy Custom next-Cancer panel-BARD1 VUS         Disposition: Brian Avery remains in clinical remission from colon cancer.  He will continue colonoscopy surveillance with Brian Avery. He  will return for an office visit and CEA in 6 months.  He plans to observe the right inguinal hernia for now.  Betsy Coder, MD  05/17/2022  12:01  PM

## 2022-07-16 ENCOUNTER — Other Ambulatory Visit: Payer: Self-pay

## 2022-07-16 ENCOUNTER — Telehealth: Payer: Self-pay

## 2022-07-16 DIAGNOSIS — C187 Malignant neoplasm of sigmoid colon: Secondary | ICD-10-CM

## 2022-07-16 NOTE — Telephone Encounter (Signed)
A patient called to request a referral to Dr. Marin Olp for treatment of an inguinal hernia.

## 2022-08-03 IMAGING — CT CT ABD-PELV W/ CM
2 of 5 series · 12 of 36 positions shown, 15 images · IV contrast (APPLIED)
Comparison: 06/05/2019 CT chest, abdomen and pelvis.

CLINICAL DATA: History of sigmoid colon cancer status post surgical
resection. Restaging.

EXAM:
CT CHEST, ABDOMEN, AND PELVIS WITH CONTRAST
TECHNIQUE: Multidetector CT imaging of the chest, abdomen and pelvis was
performed following the standard protocol during bolus
administration of intravenous contrast.
CONTRAST:  100mL OMNIPAQUE IOHEXOL 300 MG/ML  SOLN

[Series 2: cap with · axial · 0.95mm/px · z∈[+1117,+1667]mm · 9 of 138 slices shown, 12 images]
[im 14/138  mediastinal]
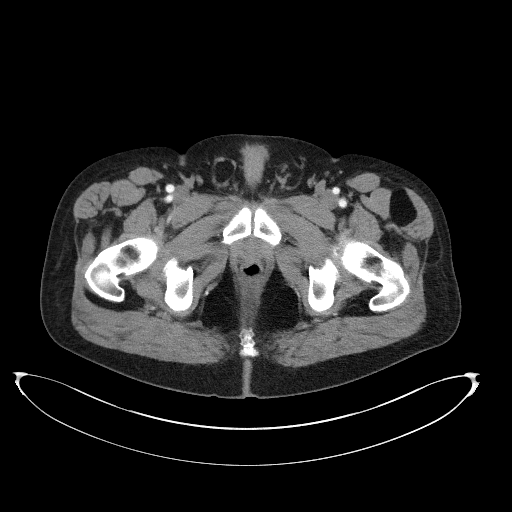
[im 14/138  lung]
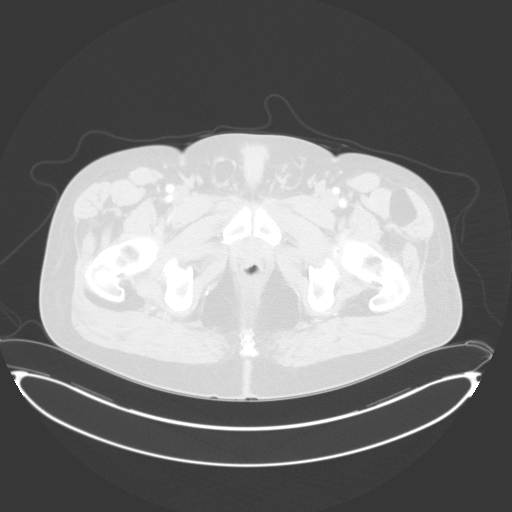
[im 28/138  lung]
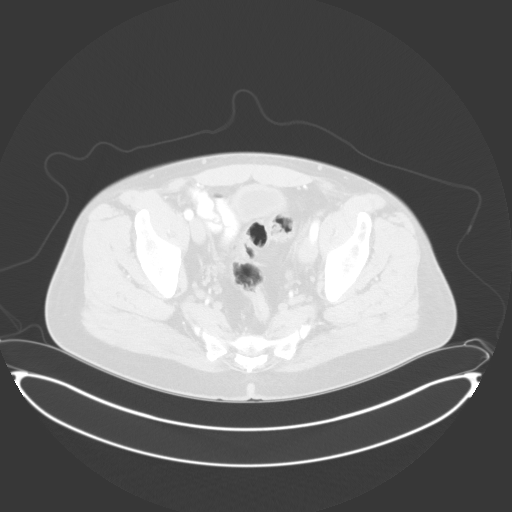
[im 42/138  lung]
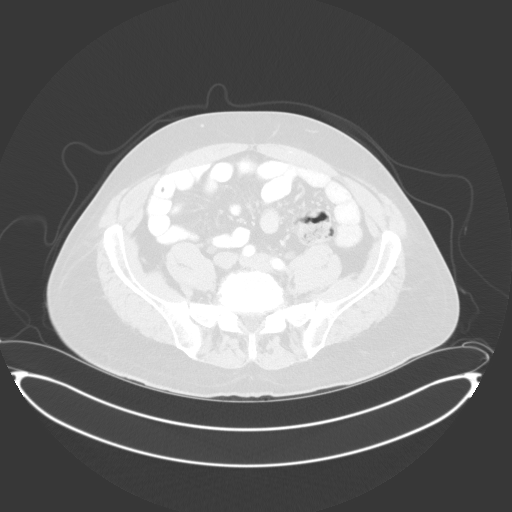
[im 55/138  lung]
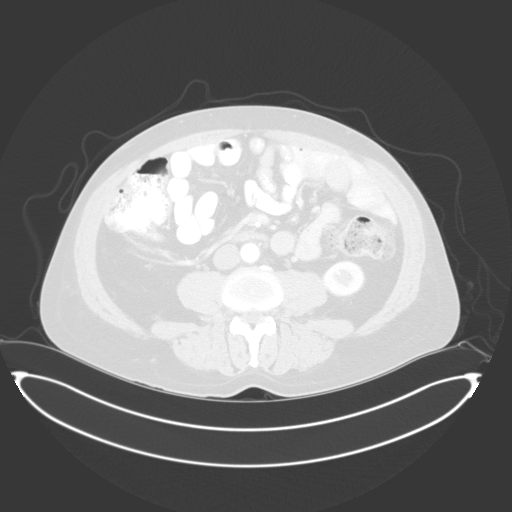
[im 69/138  mediastinal]
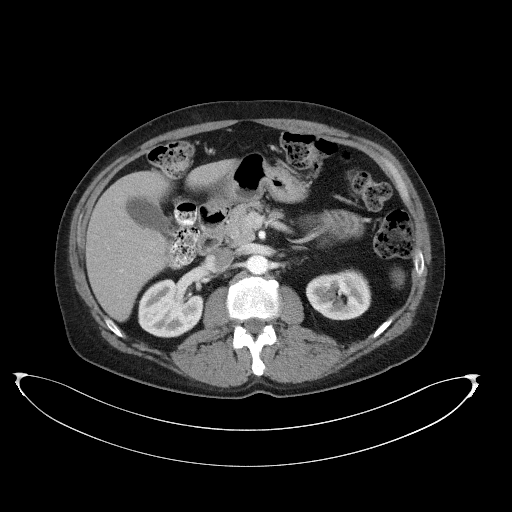
[im 69/138  lung]
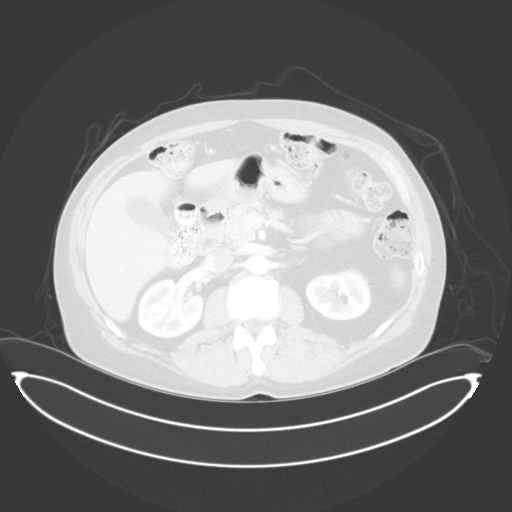
[im 83/138  lung]
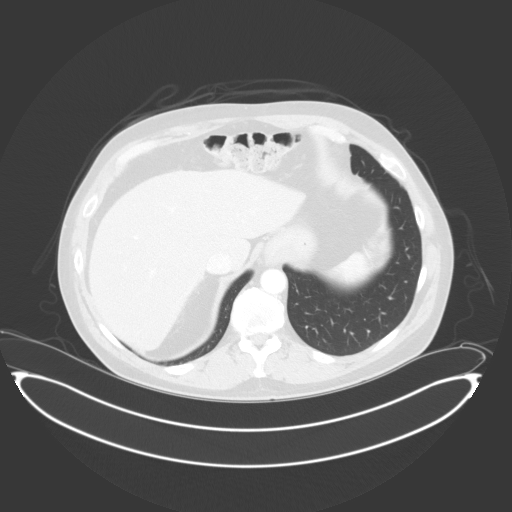
[im 96/138  lung]
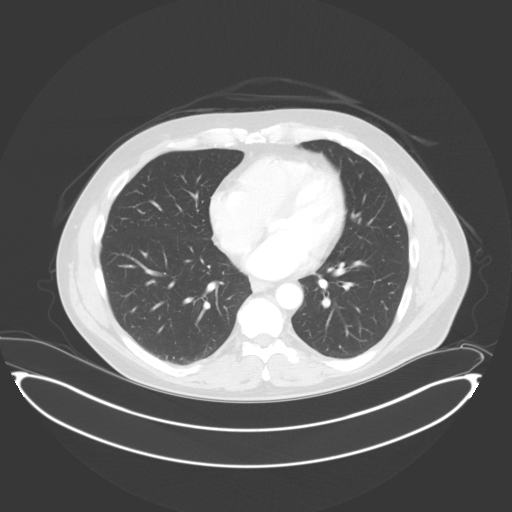
[im 110/138  lung]
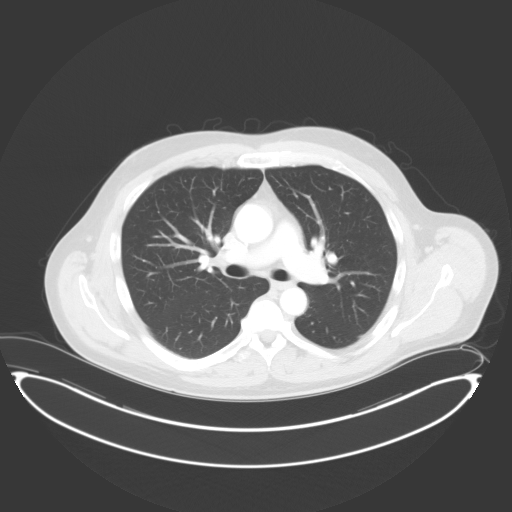
[im 124/138  mediastinal]
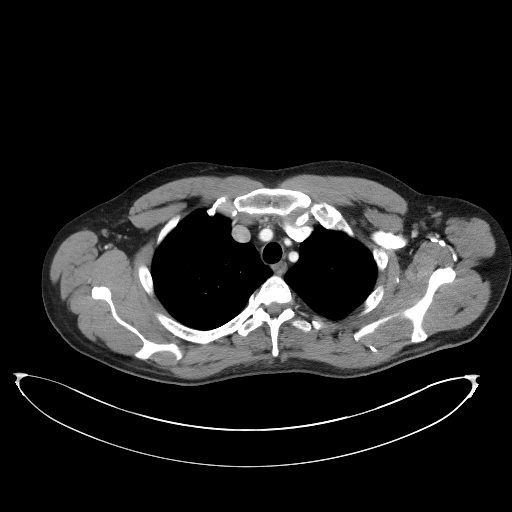
[im 124/138  lung]
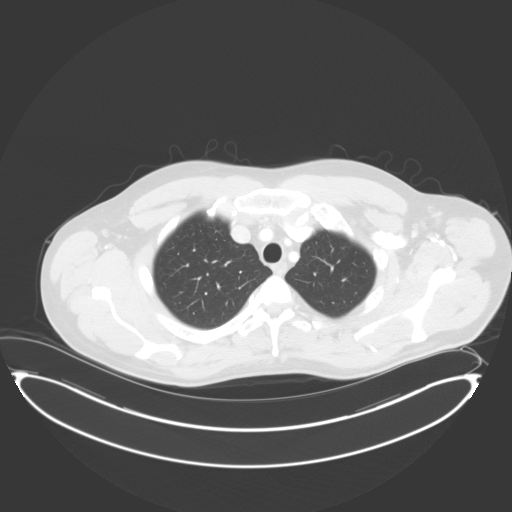

[Series 5: coronals · coronal · 0.82mm/px · 3 of 147 slices shown]
[im 30/147  lung]
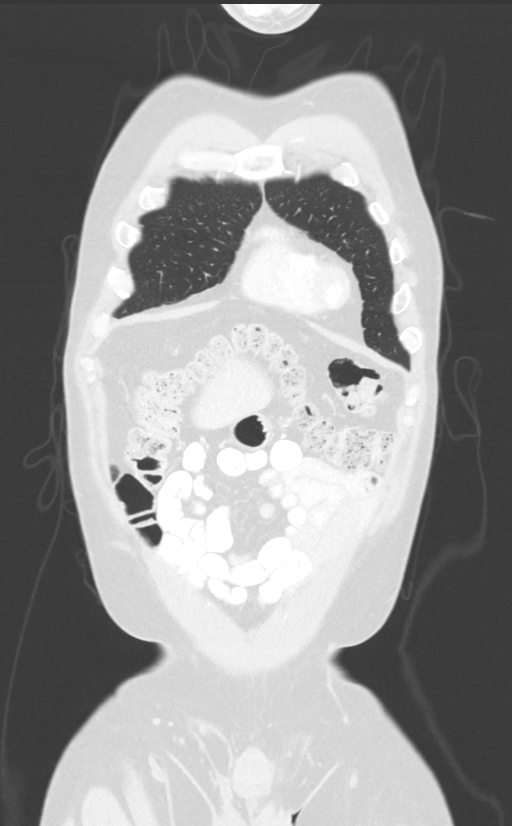
[im 59/147  lung]
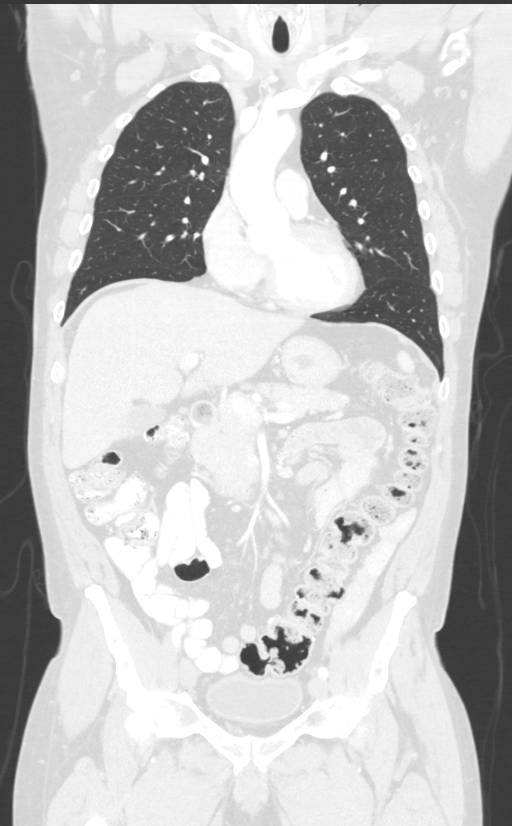
[im 88/147  lung]
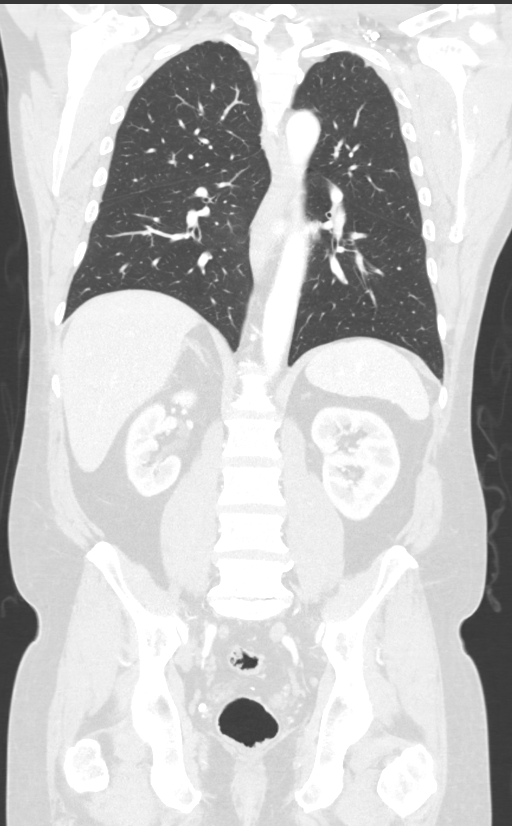

[12 of 36 positions shown; findings below may reference images not displayed]

FINDINGS: CT CHEST FINDINGS

Cardiovascular: Normal heart size. No significant pericardial
effusion/thickening. Great vessels are normal in course and caliber.
No central pulmonary emboli.

Mediastinum/Nodes: No discrete thyroid nodules. Unremarkable
esophagus. No pathologically enlarged axillary, mediastinal or hilar
lymph nodes.

Lungs/Pleura: No pneumothorax. No pleural effusion. No acute
consolidative airspace disease, lung masses or significant pulmonary
nodules.

Musculoskeletal: No aggressive appearing focal osseous lesions. Mild
thoracic spondylosis.

CT ABDOMEN PELVIS FINDINGS

Hepatobiliary: Normal liver with no liver mass. Normal gallbladder
with no radiopaque cholelithiasis. No biliary ductal dilatation.

Pancreas: Normal, with no mass or duct dilation.

Spleen: Normal size. No mass.

Adrenals/Urinary Tract: Normal adrenals. Normal kidneys with no
hydronephrosis and no renal mass. Normal bladder.

Stomach/Bowel: Small hiatal hernia. Otherwise normal nondistended
stomach. Normal caliber small bowel with no small bowel wall
thickening. Normal appendix. Oral contrast transits to the right
colon. Interval partial distal colectomy with intact appearing
distal colonic anastomosis. No large bowel wall thickening,
diverticulosis or significant pericolonic fat stranding.
Mild-to-moderate diffuse colonic stool.

Vascular/Lymphatic: Normal caliber abdominal aorta. Patent portal,
splenic, hepatic and renal veins. Mildly enlarged 1.0 cm short axis
diameter left para-aortic node (series 2/image 74) is stable. No new
pathologically enlarged lymph nodes in the abdomen or pelvis.

Reproductive: Normal size prostate.

Other: No pneumoperitoneum, ascites or focal fluid collection.

Musculoskeletal: No aggressive appearing focal osseous lesions.
Moderate lumbar spondylosis.
IMPRESSION: 1. No evidence of local tumor recurrence status post partial distal
colectomy.
2. No evidence of metastatic disease in the chest.
3. No findings suspicious for metastatic disease in the abdomen or
pelvis. Stable mild left para-aortic lymphadenopathy, more likely
reactive.
4. Small hiatal hernia.

## 2022-08-10 DIAGNOSIS — K4090 Unilateral inguinal hernia, without obstruction or gangrene, not specified as recurrent: Secondary | ICD-10-CM | POA: Diagnosis not present

## 2022-08-25 DIAGNOSIS — M25522 Pain in left elbow: Secondary | ICD-10-CM | POA: Diagnosis not present

## 2022-11-19 ENCOUNTER — Inpatient Hospital Stay: Payer: 59 | Admitting: Oncology

## 2022-11-19 ENCOUNTER — Inpatient Hospital Stay: Payer: 59 | Attending: Family Medicine

## 2023-01-10 ENCOUNTER — Inpatient Hospital Stay (HOSPITAL_BASED_OUTPATIENT_CLINIC_OR_DEPARTMENT_OTHER): Payer: 59 | Admitting: Oncology

## 2023-01-10 ENCOUNTER — Inpatient Hospital Stay: Payer: 59 | Attending: Oncology

## 2023-01-10 VITALS — BP 113/77 | HR 60 | Temp 97.9°F | Resp 18 | Ht 72.0 in | Wt 215.2 lb

## 2023-01-10 DIAGNOSIS — C187 Malignant neoplasm of sigmoid colon: Secondary | ICD-10-CM | POA: Diagnosis not present

## 2023-01-10 DIAGNOSIS — Z8041 Family history of malignant neoplasm of ovary: Secondary | ICD-10-CM | POA: Insufficient documentation

## 2023-01-10 DIAGNOSIS — Z803 Family history of malignant neoplasm of breast: Secondary | ICD-10-CM | POA: Diagnosis not present

## 2023-01-10 DIAGNOSIS — Z85038 Personal history of other malignant neoplasm of large intestine: Secondary | ICD-10-CM | POA: Insufficient documentation

## 2023-01-10 LAB — CEA (ACCESS): CEA (CHCC): <1 ng/mL (ref 0.00–5.00)

## 2023-01-10 NOTE — Progress Notes (Signed)
[facility] OFFICE PROGRESS NOTE   Diagnosis: Colon cancer  INTERVAL HISTORY:   Mr. Farrand returns as scheduled.  He feels well.  No difficulty with bowel function.  No bleeding.  Good appetite and energy level.  He is exercising.  There is a persistent right inguinal hernia.  This is not painful.  He saw Dr. Cliffton Asters and decided against hernia repair.  Objective:  Vital signs in last 24 hours:  Blood pressure 113/77, pulse 60, temperature 97.9 F (36.6 C), temperature source Temporal, resp. rate 18, height 6' (1.829 m), weight 215 lb 3.2 oz (97.6 kg), SpO2 100%.    Lymphatics: No cervical, supraclavicular, axillary, or inguinal nodes Resp: Lungs clear bilaterally Cardio: Regular rate and rhythm GI: No hepatosplenomegaly, nontender, no mass, soft reducible right inguinal hernia Vascular: No leg edema    Lab Results:  Lab Results  Component Value Date   WBC 4.9 02/01/2020   HGB 16.0 02/01/2020   HCT 46.2 02/01/2020   MCV 108.2 (H) 02/01/2020   PLT 254 02/01/2020   NEUTROABS 2.1 02/01/2020    CMP  Lab Results  Component Value Date   NA 134 (L) 05/10/2022   K 4.2 05/10/2022   CL 100 05/10/2022   CO2 29 05/10/2022   GLUCOSE 94 05/10/2022   BUN 24 (H) 05/10/2022   CREATININE 1.07 05/10/2022   CALCIUM 9.5 05/10/2022   PROT 7.4 02/01/2020   ALBUMIN 4.5 02/01/2020   AST 38 02/01/2020   ALT 25 02/01/2020   ALKPHOS 45 02/01/2020   BILITOT 0.6 02/01/2020   GFRNONAA >60 05/10/2022   GFRAA >60 10/31/2019    Lab Results  Component Value Date   CEA1 1.69 10/06/2020   CEA <1.00 05/10/2022     Medications: I have reviewed the patient's current medications.   Assessment/Plan: Sigmoid colon cancer, stage II (T3N0) Colonoscopy 06/04/2019-cecum polyp, mass in the sigmoid colon beginning at 28 cm, biopsy confirmed a sessile serrated adenoma of the cecum, adenocarcinoma at the sigmoid colon CTs 06/05/2019-eccentric wall thickening of the proximal sigmoid  colon with subcentimeter lymph nodes in the adjacent mesocolon, prominent nonspecific retroperitoneal nodes-largest 1 x 1 cm Robotic assisted sigmoidectomy 07/23/2019, well differentiated adenocarcinoma of the proximal sigmoid colon, tumor extends through the muscularis propria, positive lymphovascular invasion, negative resection margins, no loss of mismatch repair protein expression Cycle 1 adjuvant Xeloda 08/13/2019 Cycle 2 adjuvant Xeloda 09/03/2019, held on 09/15/2019 secondary to hand/foot pain and hives, resumed 09/16/2019 Cycle 3 adjuvant Xeloda 09/24/2019, dose reduction to 1500 mg twice daily secondary to hand/foot syndrome Cycle 4 adjuvant Xeloda 10/15/2019, 1500 mg twice daily Cycle 5 adjuvant Xeloda 11/05/2019, 1500 mg twice daily Cycle 6 adjuvant Xeloda 11/26/2019, 2000 mg every morning, 1500 mg every evening for 14 days Cycle 7 adjuvant Xeloda 12/17/2019, 2000 mg every morning and 1500 mg every evening for 14 days Cycle 8 adjuvant Xeloda 01/07/2020, 2000 mg every morning and 1500 mg every evening CTs 04/03/2020-no evidence of metastatic disease, stable mildly enlarged left periaortic node Colonoscopy 06/23/2020-multiple polyps removed from the transverse and ascending colon, tubular adenoma and sessile serrated polyps, no high-grade dysplasia or malignancy CTs 04/01/2021-no evidence of metastatic disease, previously noted left periotic node not enlarged-5 mm Colonoscopy 07/27/2021-polyps removed from the ascending colon-sessile serrated polyp CT 05/10/2022-no evidence of recurrent disease, stable 5 mm left periotic node Remote history of sarcoidosis-pulmonary and skin, he reports treatment with prednisone with resolution Family history of multiple cancers including breast, ovarian, and a "gastrointestinal" malignancy Custom next-Cancer panel-BARD1 VUS  Disposition: Mr Zinger is in clinical remission from colon cancer.  We will follow-up on the CEA from today.  He will return for an office  visit and CEA in 6 months.  He will continue colonoscopy surveillance with Dr. Lavon Paganini.  Thornton Papas, MD  01/10/2023  11:54 AM

## 2023-03-10 ENCOUNTER — Encounter: Payer: Self-pay | Admitting: Gastroenterology

## 2023-03-16 DIAGNOSIS — H5213 Myopia, bilateral: Secondary | ICD-10-CM | POA: Diagnosis not present

## 2023-03-28 ENCOUNTER — Ambulatory Visit (AMBULATORY_SURGERY_CENTER): Payer: 59

## 2023-03-28 VITALS — Ht 72.0 in | Wt 218.0 lb

## 2023-03-28 DIAGNOSIS — Z85038 Personal history of other malignant neoplasm of large intestine: Secondary | ICD-10-CM

## 2023-03-28 MED ORDER — SUFLAVE 178.7 G PO SOLR: 1.0000 | Freq: Once | ORAL | 0 refills | Status: AC

## 2023-03-28 NOTE — Progress Notes (Addendum)
 No egg or soy allergy known to patient  No issues known to pt with past sedation with any surgeries or procedures Patient denies ever being told they had issues or difficulty with intubation  No FH of Malignant Hyperthermia Pt is not on diet pills Pt is not on home 02  Pt is not on blood thinners  Pt denies issues with constipation  No A fib or A flutter Have any cardiac testing pending--no Ambulates independently Patient's chart reviewed by [name] prior to previsit and patient appropriate for the [facility].  Previsit completed and red dot placed by patient's name on their procedure day (on provider's schedule).

## 2023-05-09 ENCOUNTER — Encounter: Payer: 59 | Admitting: Gastroenterology

## 2023-05-12 ENCOUNTER — Encounter: Payer: 59 | Admitting: Gastroenterology

## 2023-06-10 ENCOUNTER — Ambulatory Visit

## 2023-06-10 ENCOUNTER — Other Ambulatory Visit (HOSPITAL_BASED_OUTPATIENT_CLINIC_OR_DEPARTMENT_OTHER): Payer: Self-pay

## 2023-06-10 VITALS — Ht 72.0 in | Wt 220.0 lb

## 2023-06-10 DIAGNOSIS — Z85038 Personal history of other malignant neoplasm of large intestine: Secondary | ICD-10-CM

## 2023-06-10 MED ORDER — PEG 3350-KCL-NA BICARB-NACL 420 G PO SOLR
4000.0000 mL | Freq: Once | ORAL | 0 refills | Status: AC
  Filled 2023-06-10: qty 4000, 1d supply, fill #0

## 2023-06-10 NOTE — Progress Notes (Signed)

## 2023-06-14 ENCOUNTER — Other Ambulatory Visit (HOSPITAL_BASED_OUTPATIENT_CLINIC_OR_DEPARTMENT_OTHER): Payer: Self-pay

## 2023-06-17 ENCOUNTER — Encounter: Payer: Self-pay | Admitting: Gastroenterology

## 2023-06-22 ENCOUNTER — Encounter: Payer: Self-pay | Admitting: Gastroenterology

## 2023-06-27 ENCOUNTER — Ambulatory Visit (AMBULATORY_SURGERY_CENTER): Admitting: Gastroenterology

## 2023-06-27 ENCOUNTER — Encounter: Payer: Self-pay | Admitting: Gastroenterology

## 2023-06-27 VITALS — BP 101/65 | HR 59 | Temp 98.1°F | Resp 18 | Ht 72.0 in | Wt 220.0 lb

## 2023-06-27 DIAGNOSIS — D128 Benign neoplasm of rectum: Secondary | ICD-10-CM

## 2023-06-27 DIAGNOSIS — K644 Residual hemorrhoidal skin tags: Secondary | ICD-10-CM

## 2023-06-27 DIAGNOSIS — K648 Other hemorrhoids: Secondary | ICD-10-CM

## 2023-06-27 DIAGNOSIS — Z860101 Personal history of adenomatous and serrated colon polyps: Secondary | ICD-10-CM | POA: Diagnosis not present

## 2023-06-27 DIAGNOSIS — Z85038 Personal history of other malignant neoplasm of large intestine: Secondary | ICD-10-CM

## 2023-06-27 DIAGNOSIS — Z1211 Encounter for screening for malignant neoplasm of colon: Secondary | ICD-10-CM

## 2023-06-27 MED ORDER — SODIUM CHLORIDE 0.9 % IV SOLN: 500.0000 mL | Freq: Once | INTRAVENOUS | Status: DC

## 2023-06-27 NOTE — Progress Notes (Signed)
 Pt's states no medical or surgical changes since previsit or office visit.

## 2023-06-27 NOTE — Progress Notes (Signed)
 [facility] History and Physical   Primary Care Physician:  Pete Brand, DO   Reason for Procedure:  History of adenomatous colon polyps, personal history of colon cancer  Plan:    Surveillance colonoscopy with possible interventions as needed     HPI: Brian Avery is a very pleasant 58 y.o. male here for surveillance colonoscopy. Denies any nausea, vomiting, abdominal pain, melena or bright red blood per rectum  The risks and benefits as well as alternatives of endoscopic procedure(s) have been discussed and reviewed. All questions answered. The patient agrees to proceed.    Past Medical History:  Diagnosis Date   Allergy    Arthritis    Cancer (HCC)    colon cancer 4-19- 2021- surgery 07-2019- pill chemo - no iv chemo, no radiation    Family history of breast cancer    Family history of colon cancer    Family history of ovarian cancer    GERD (gastroesophageal reflux disease)    mild    Sarcoidosis 1995   Sarcoidosis of lung (HCC) 1998    Past Surgical History:  Procedure Laterality Date   COLON SURGERY     colectomy 07-2019   COLONOSCOPY     FLEXIBLE SIGMOIDOSCOPY N/A 07/23/2019   Procedure: FLEXIBLE SIGMOIDOSCOPY;  Surgeon: Melvenia Stabs, MD;  Location: [facility];  Service: General;  Laterality: N/A;   KNEE ARTHROSCOPY  2010   right   KNEE ARTHROSCOPY Right 01/30/2016   Procedure: ARTHROSCOPY KNEE;  Surgeon: Winston Hawking, MD;  Location: [facility] OR;  Service: Orthopedics;  Laterality: Right;   NECK SURGERY     lymphnode removed   POLYPECTOMY      Prior to Admission medications   Not on File    No current outpatient medications on file.   Current Facility-Administered Medications  Medication Dose Route Frequency Provider Last Rate Last Admin   0.9 %  sodium chloride  infusion  500 mL Intravenous Once Dary Dilauro V, MD        Allergies as of 06/27/2023   (No Known Allergies)    Family History  Problem Relation Age of Onset   Breast  cancer Mother 83       second dx at 63   Factor V Leiden deficiency Mother    Colon polyps Mother    Hypertension Father    Colon polyps Father    Colon cancer Maternal Grandfather 60       d. 84   Ovarian cancer Sister 43   Prostate cancer Maternal Uncle 77       d. 80   Dementia Paternal Uncle    Heart disease Maternal Grandmother    Lung cancer Paternal Grandfather    Colon cancer Maternal Uncle 85   Factor V Leiden deficiency Maternal Uncle    Thrombosis Maternal Uncle        due to FVL   Esophageal cancer Neg Hx    Stomach cancer Neg Hx    Rectal cancer Neg Hx     Social History   Socioeconomic History   Marital status: Married    Spouse name: Not on file   Number of children: Not on file   Years of education: Not on file   Highest education level: Not on file  Occupational History   Not on file  Tobacco Use   Smoking status: Never   Smokeless tobacco: Former  Advertising account planner   Vaping status: Never Used  Substance and Sexual Activity   Alcohol  use: Not Currently   Drug use: No   Sexual activity: Not on file  Other Topics Concern   Not on file  Social History Narrative   Not on file   Social Drivers of Health   Financial Resource Strain: Not on file  Food Insecurity: Not on file  Transportation Needs: Not on file  Physical Activity: Not on file  Stress: Not on file  Social Connections: Not on file  Intimate Partner Violence: Not on file    Review of Systems:  All other review of systems negative except as mentioned in the HPI.  Physical Exam: Vital signs in last 24 hours: BP (!) 150/73   Pulse 66   Temp 98.1 F (36.7 C)   Ht 6' (1.829 m)   Wt 220 lb (99.8 kg)   SpO2 98%   BMI 29.84 kg/m  General:   Alert, NAD Lungs:  Clear .   Heart:  Regular rate and rhythm Abdomen:  Soft, nontender and nondistended. Neuro/Psych:  Alert and cooperative. Normal mood and affect. A and O x 3  Reviewed labs, radiology imaging, old records and pertinent past  GI work up  Patient is appropriate for planned procedure(s) and anesthesia in an ambulatory setting   K. Veena Geetika Laborde , MD [phone]

## 2023-06-27 NOTE — Progress Notes (Signed)
 Report given to PACU, vss

## 2023-06-27 NOTE — Patient Instructions (Signed)
 **  Handouts given on polyps and hemorrhoids**  YOU HAD AN ENDOSCOPIC PROCEDURE TODAY AT [facility]:   Refer to the procedure report that was given to you for any specific questions about what was found during the examination.  If the procedure report does not answer your questions, please call your gastroenterologist to clarify.  If you requested that your care partner not be given the details of your procedure findings, then the procedure report has been included in a sealed envelope for you to review at your convenience later.  YOU SHOULD EXPECT: Some feelings of bloating in the abdomen. Passage of more gas than usual.  Walking can help get rid of the air that was put into your GI tract during the procedure and reduce the bloating. If you had a lower endoscopy (such as a colonoscopy or flexible sigmoidoscopy) you may notice spotting of blood in your stool or on the toilet paper. If you underwent a bowel prep for your procedure, you may not have a normal bowel movement for a few days.  Please Note:  You might notice some irritation and congestion in your nose or some drainage.  This is from the oxygen used during your procedure.  There is no need for concern and it should clear up in a day or so.  SYMPTOMS TO REPORT IMMEDIATELY:  Following lower endoscopy (colonoscopy or flexible sigmoidoscopy):  Excessive amounts of blood in the stool  Significant tenderness or worsening of abdominal pains  Swelling of the abdomen that is new, acute  Fever of 100F or higher  For urgent or emergent issues, a gastroenterologist can be reached at any hour by calling [phone]. Do not use MyChart messaging for urgent concerns.    DIET:  We do recommend a small meal at first, but then you may proceed to your regular diet.  Drink plenty of fluids but you should avoid alcoholic beverages for 24 hours.  ACTIVITY:  You should plan to take it easy for the rest of today and you should NOT  DRIVE or use heavy machinery until tomorrow (because of the sedation medicines used during the test).    FOLLOW UP: Our staff will call the number listed on your records the next business day following your procedure.  We will call around 7:15- 8:00 am to check on you and address any questions or concerns that you may have regarding the information given to you following your procedure. If we do not reach you, we will leave a message.     If any biopsies were taken you will be contacted by phone or by letter within the next 1-3 weeks.  Please call us at [phone] if you have not heard about the biopsies in 3 weeks.    SIGNATURES/CONFIDENTIALITY: You and/or your care partner have signed paperwork which will be entered into your electronic medical record.  These signatures attest to the fact that that the information above on your After Visit Summary has been reviewed and is understood.  Full responsibility of the confidentiality of this discharge information lies with you and/or your care-partner.

## 2023-06-27 NOTE — Progress Notes (Signed)
 Called to room to assist during endoscopic procedure.  Patient ID and intended procedure confirmed with present staff. Received instructions for my participation in the procedure from the performing physician.

## 2023-06-27 NOTE — Op Note (Signed)
 [facility] Patient Name: Brian Avery Procedure Date: 06/27/2023 9:28 AM MRN: [id number] Endoscopist: Sergio Dandy , MD, [id number] Age: 58 Referring MD:  Date of Birth: [birth year] Gender: Male Account #: [id number] Procedure:                Colonoscopy Indications:              High risk colon cancer surveillance: Personal                            history of colonic polyps, High risk colon cancer                            surveillance: Personal history of colon cancer Medicines:                Monitored Anesthesia Care Procedure:                Pre-Anesthesia Assessment:                           - Prior to the procedure, a History and Physical                            was performed, and patient medications and                            allergies were reviewed. The patient's tolerance of                            previous anesthesia was also reviewed. The risks                            and benefits of the procedure and the sedation                            options and risks were discussed with the patient.                            All questions were answered, and informed consent                            was obtained. Prior Anticoagulants: The patient has                            taken no anticoagulant or antiplatelet agents. ASA                            Grade Assessment: II - A patient with mild systemic                            disease. After reviewing the risks and benefits,                            the patient was deemed in satisfactory condition to  undergo the procedure.                           After obtaining informed consent, the colonoscope                            was passed under direct vision. Throughout the                            procedure, the patient's blood pressure, pulse, and                            oxygen saturations were monitored continuously. The                            [id number]  Slim SN [id number] was introduced through                            the anus and advanced to the the cecum, identified                            by appendiceal orifice and ileocecal valve. The                            colonoscopy was performed without difficulty. The                            patient tolerated the procedure well. The quality                            of the bowel preparation was adequate to identify                            polyps greater than 5 mm in size. The ileocecal                            valve, appendiceal orifice, and rectum were                            photographed. Scope In: 9:33:53 AM Scope Out: 9:49:53 AM Scope Withdrawal Time: 0 hours 12 minutes 6 seconds  Total Procedure Duration: 0 hours 16 minutes 0 seconds  Findings:                 The perianal and digital rectal examinations were                            normal.                           A 5 mm polyp was found in the rectum. The polyp was                            sessile. The polyp was removed with a cold snare.  Resection and retrieval were complete.                           There was evidence of a prior end-to-end                            colo-colonic anastomosis in the sigmoid colon. This                            was patent and was characterized by healthy                            appearing mucosa.                           Non-bleeding external and internal hemorrhoids were                            found during retroflexion. The hemorrhoids were                            medium-sized. Complications:            No immediate complications. Estimated Blood Loss:     Estimated blood loss was minimal. Impression:               - One 5 mm polyp in the rectum, removed with a cold                            snare. Resected and retrieved.                           - Patent end-to-end colo-colonic anastomosis,                            characterized by  healthy appearing mucosa.                           - Non-bleeding external and internal hemorrhoids. Recommendation:           - Patient has a contact number available for                            emergencies. The signs and symptoms of potential                            delayed complications were discussed with the                            patient. Return to normal activities tomorrow.                            Written discharge instructions were provided to the                            patient.                           -  Resume previous diet.                           - Continue present medications.                           - Await pathology results.                           - Repeat colonoscopy in 3 years for surveillance                            based on pathology results.                           - For future colonoscopy the patient will require                            an extended preparation. If there are any                            questions, please contact the gastroenterologist. Sergio Dandy, MD 06/27/2023 9:58:50 AM This report has been signed electronically.

## 2023-06-28 ENCOUNTER — Telehealth: Payer: Self-pay

## 2023-06-28 NOTE — Telephone Encounter (Signed)
 Follow up call to pt, no answer.

## 2023-06-29 LAB — SURGICAL PATHOLOGY

## 2023-07-12 ENCOUNTER — Inpatient Hospital Stay: Payer: 59 | Attending: Oncology

## 2023-07-12 ENCOUNTER — Inpatient Hospital Stay (HOSPITAL_BASED_OUTPATIENT_CLINIC_OR_DEPARTMENT_OTHER): Payer: 59 | Admitting: Oncology

## 2023-07-12 VITALS — BP 109/81 | HR 77 | Temp 98.0°F | Resp 18 | Ht 72.0 in | Wt 218.0 lb

## 2023-07-12 DIAGNOSIS — Z85038 Personal history of other malignant neoplasm of large intestine: Secondary | ICD-10-CM | POA: Insufficient documentation

## 2023-07-12 DIAGNOSIS — C187 Malignant neoplasm of sigmoid colon: Secondary | ICD-10-CM | POA: Diagnosis not present

## 2023-07-12 LAB — CEA (ACCESS): CEA (CHCC): <1 ng/mL (ref 0.00–5.00)

## 2023-07-12 NOTE — Progress Notes (Signed)
 [facility] OFFICE PROGRESS NOTE   Diagnosis: Colon cancer  INTERVAL HISTORY:   Brian. Lorey is as scheduled.  He feels well.  No difficulty with bowel function.  No bleeding.  No complaint.  He went a colonoscopy 06/27/2023.  A 5 mm polyp was removed from the rectum.  The pathology returned as a tubular adenoma. He has a persistent reducible right inguinal hernia.  The hernia is not painful.  Objective:  Vital signs in last 24 hours:  Blood pressure 109/81, pulse 77, temperature 98 F (36.7 C), temperature source Temporal, resp. rate 18, height 6' (1.829 m), weight 218 lb (98.9 kg), SpO2 98%.  Lymphatics: No cervical, supraclavicular, axillary, or inguinal nodes Resp: Lungs are bilaterally Cardio: Regular rate and rhythm GI: No hepatosplenomegaly, no mass, nontender, reducible right inguinal hernia Vascular: No leg edema   Lab Results:  Lab Results  Component Value Date   WBC 4.9 02/01/2020   HGB 16.0 02/01/2020   HCT 46.2 02/01/2020   MCV 108.2 (H) 02/01/2020   PLT 254 02/01/2020   NEUTROABS 2.1 02/01/2020    CMP  Lab Results  Component Value Date   NA 134 (L) 05/10/2022   K 4.2 05/10/2022   CL 100 05/10/2022   CO2 29 05/10/2022   GLUCOSE 94 05/10/2022   BUN 24 (H) 05/10/2022   CREATININE 1.07 05/10/2022   CALCIUM 9.5 05/10/2022   PROT 7.4 02/01/2020   ALBUMIN 4.5 02/01/2020   AST 38 02/01/2020   ALT 25 02/01/2020   ALKPHOS 45 02/01/2020   BILITOT 0.6 02/01/2020   GFRNONAA >60 05/10/2022   GFRAA >60 10/31/2019    Lab Results  Component Value Date   CEA1 1.69 10/06/2020   CEA <1.00 01/10/2023     Medications: I have reviewed the patient's current medications.   Assessment/Plan: Sigmoid colon cancer, stage II (T3N0) Colonoscopy 06/04/2019-cecum polyp, mass in the sigmoid colon beginning at 28 cm, biopsy confirmed a sessile serrated adenoma of the cecum, adenocarcinoma at the sigmoid colon CTs 06/05/2019-eccentric wall thickening of  the proximal sigmoid colon with subcentimeter lymph nodes in the adjacent mesocolon, prominent nonspecific retroperitoneal nodes-largest 1 x 1 cm Robotic assisted sigmoidectomy 07/23/2019, well differentiated adenocarcinoma of the proximal sigmoid colon, tumor extends through the muscularis propria, positive lymphovascular invasion, negative resection margins, no loss of mismatch repair protein expression Cycle 1 adjuvant Xeloda  08/13/2019 Cycle 2 adjuvant Xeloda  09/03/2019, held on 09/15/2019 secondary to hand/foot pain and hives, resumed 09/16/2019 Cycle 3 adjuvant Xeloda  09/24/2019, dose reduction to 1500 mg twice daily secondary to hand/foot syndrome Cycle 4 adjuvant Xeloda  10/15/2019, 1500 mg twice daily Cycle 5 adjuvant Xeloda  11/05/2019, 1500 mg twice daily Cycle 6 adjuvant Xeloda  11/26/2019, 2000 mg every morning, 1500 mg every evening for 14 days Cycle 7 adjuvant Xeloda  12/17/2019, 2000 mg every morning and 1500 mg every evening for 14 days Cycle 8 adjuvant Xeloda  01/07/2020, 2000 mg every morning and 1500 mg every evening CTs 04/03/2020-no evidence of metastatic disease, stable mildly enlarged left periaortic node Colonoscopy 06/23/2020-multiple polyps removed from the transverse and ascending colon, tubular adenoma and sessile serrated polyps, no high-grade dysplasia or malignancy CTs 04/01/2021-no evidence of metastatic disease, previously noted left periotic node not enlarged-5 mm Colonoscopy 07/27/2021-polyps removed from the ascending colon-sessile serrated polyp CT 05/10/2022-no evidence of recurrent disease, stable 5 mm left periotic node Colonoscopy 06/27/2023-polyp removed from the rectum-tubular adenoma Remote history of sarcoidosis-pulmonary and skin, he reports treatment with prednisone with resolution Family history of multiple cancers including breast,  ovarian, and a "gastrointestinal" malignancy Custom next-Cancer panel-BARD1 VUS          Disposition: Brian Avery is in clinical  remission from colon cancer.  He is now 4 years out from diagnosis.  We will follow-up on the CEA from today.  He will return for an office visit and CEA in 6 months.  Coni Deep, MD  07/12/2023  3:20 PM

## 2023-07-15 ENCOUNTER — Other Ambulatory Visit (HOSPITAL_COMMUNITY): Payer: Self-pay

## 2023-07-27 ENCOUNTER — Ambulatory Visit: Payer: Self-pay | Admitting: Gastroenterology

## 2023-08-01 IMAGING — CT CT CHEST-ABD-PELV W/ CM
2 of 5 series · 12 of 46 positions shown, 14 images · IV contrast (APPLIED)
Comparison: Multiple priors, most recently CT of the chest, abdomen
and pelvis 04/03/2020.
COMPARISON: Multiple priors, most recently CT of the chest, abdomen
and pelvis 04/03/2020.

Addendum:
CLINICAL DATA: 55-year-old male with history of colon cancer status
post surgical resection. Follow-up study.

EXAM:
CT CHEST, ABDOMEN, AND PELVIS WITH CONTRAST
TECHNIQUE: Multidetector CT imaging of the chest, abdomen and pelvis was
performed following the standard protocol during bolus
administration of intravenous contrast.

[Series 2: cap with · axial · 0.95mm/px · z∈[-160,+420]mm · 9 of 140 slices shown, 11 images]
[im 12/140  soft-tissue]
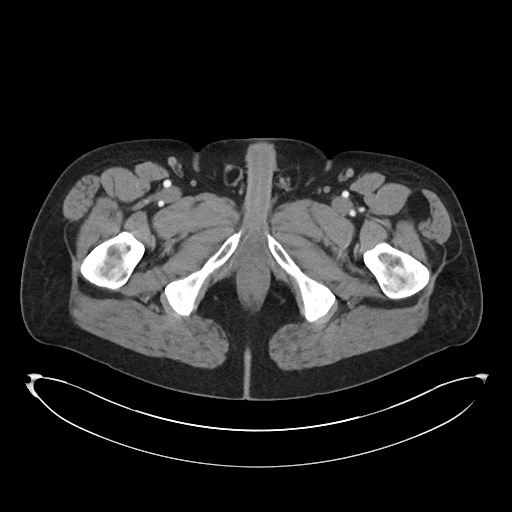
[im 12/140  bone]
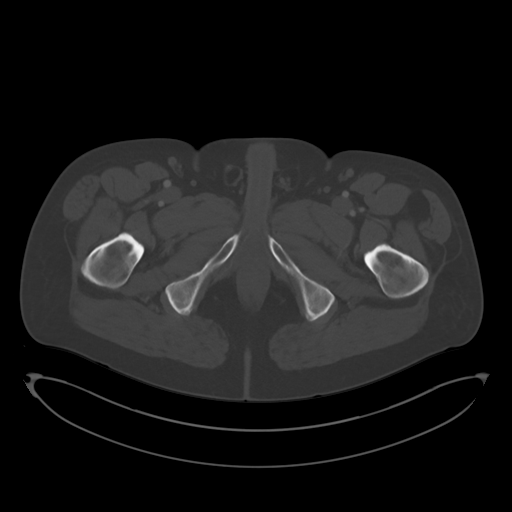
[im 24/140  soft-tissue]
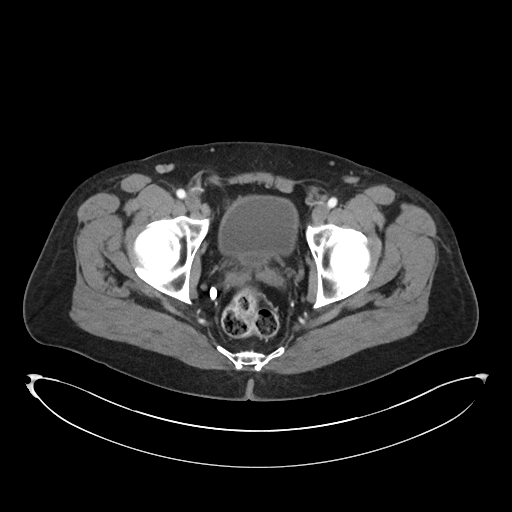
[im 47/140  soft-tissue]
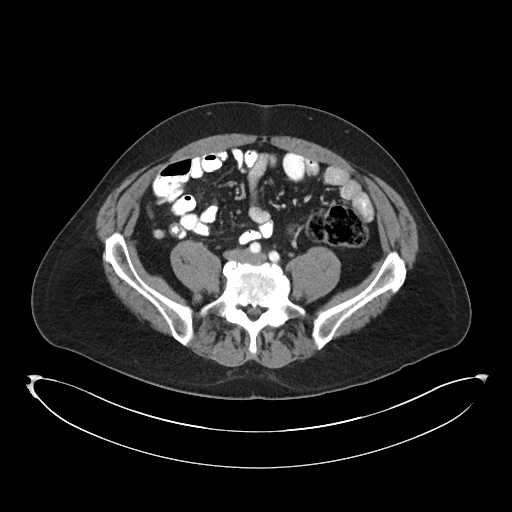
[im 58/140  soft-tissue]
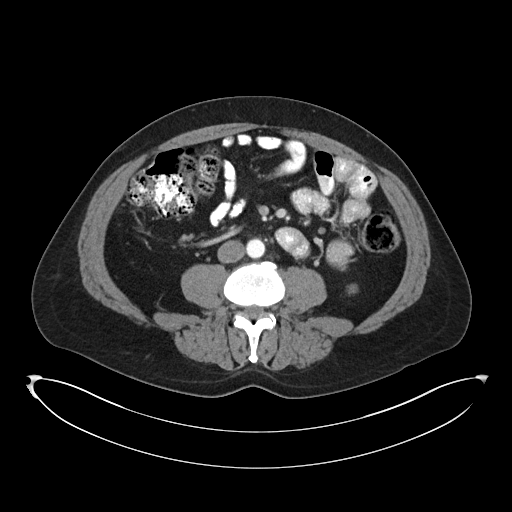
[im 70/140  soft-tissue]
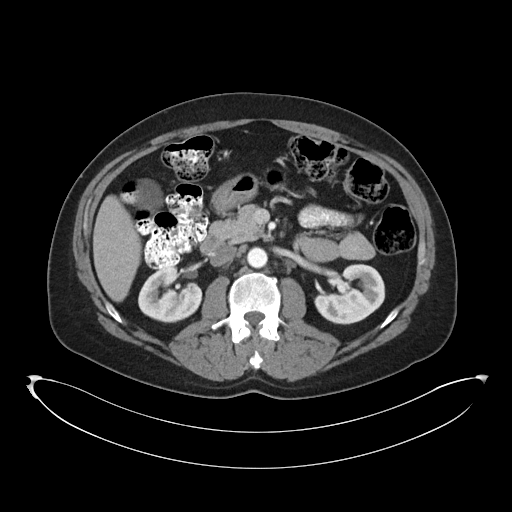
[im 82/140  soft-tissue]
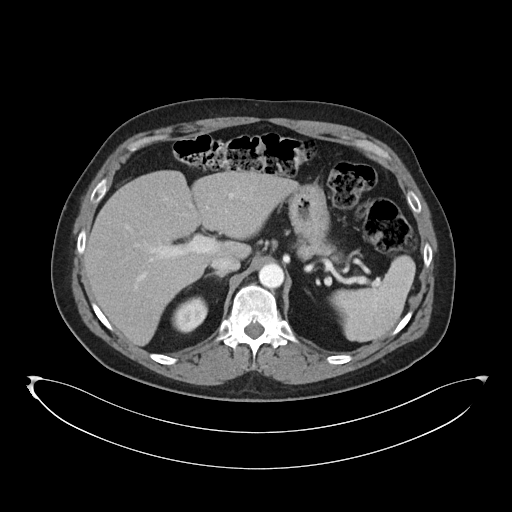
[im 93/140  soft-tissue]
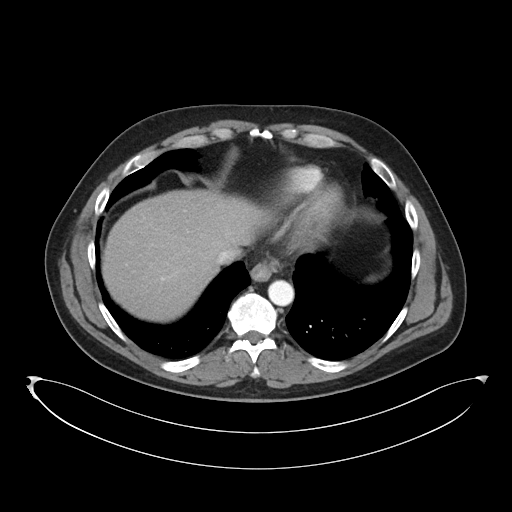
[im 116/140  soft-tissue]
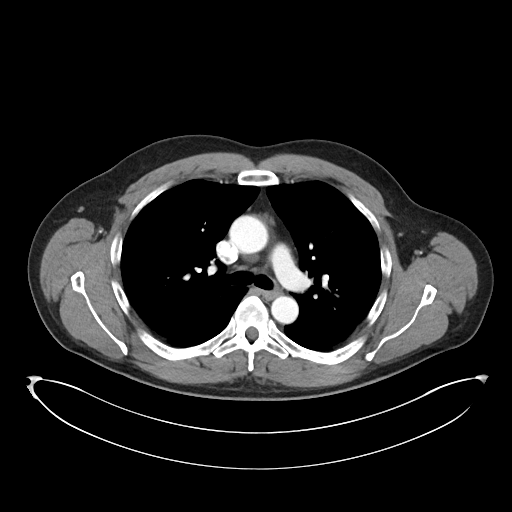
[im 128/140  soft-tissue]
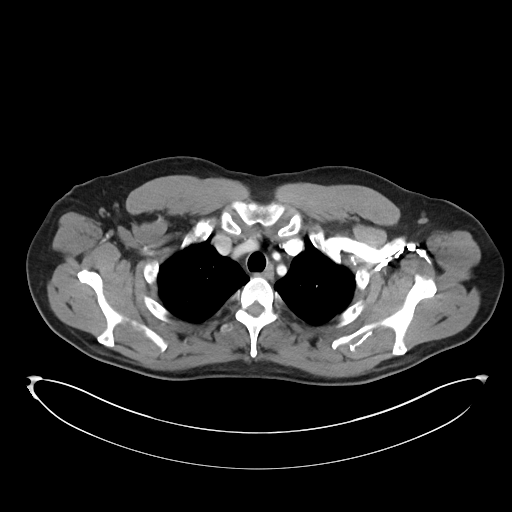
[im 128/140  bone]
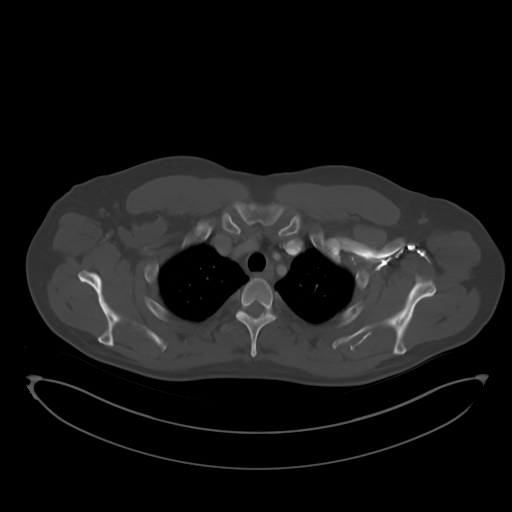

[Series 5: coronal · coronal · 0.95mm/px · 3 of 110 slices shown]
[im 37/110  soft-tissue]
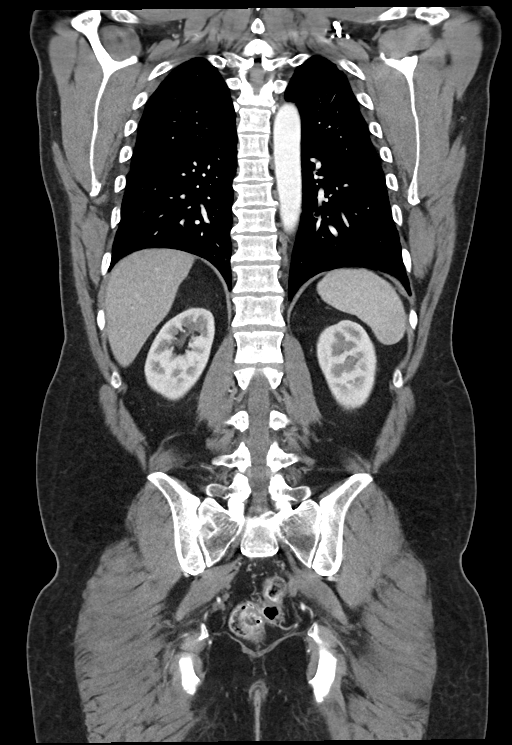
[im 49/110  soft-tissue]
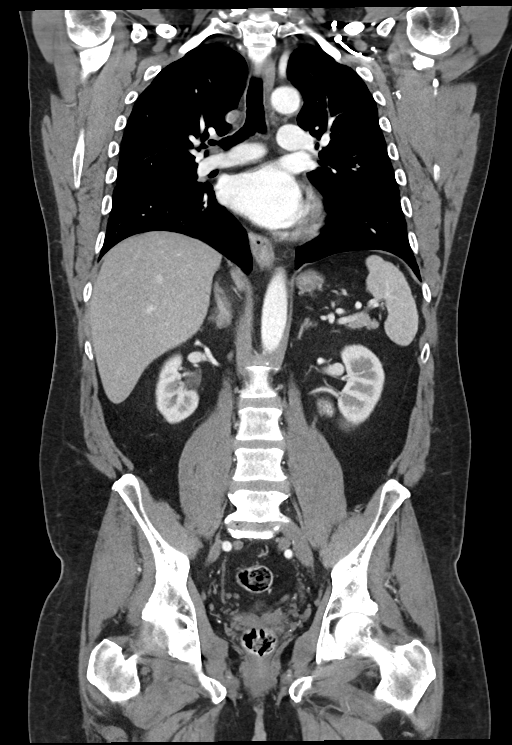
[im 61/110  soft-tissue]
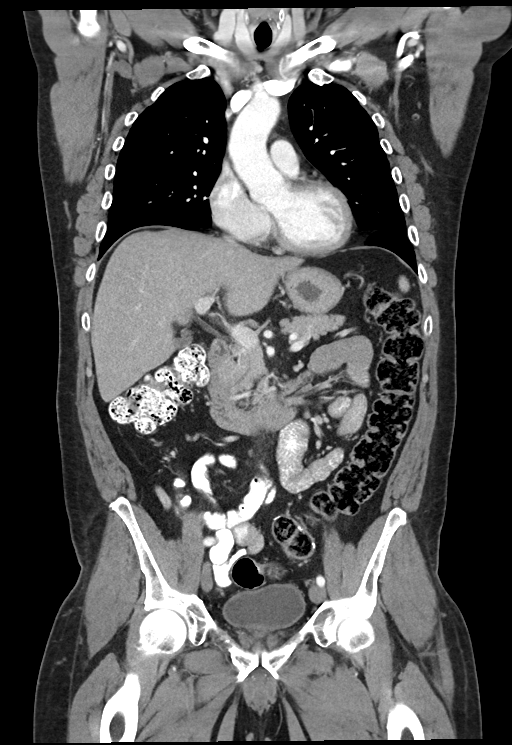

[12 of 46 positions shown; findings below may reference images not displayed]

RADIATION DOSE REDUCTION: This exam was performed according to the
departmental dose-optimization program which includes automated
exposure control, adjustment of the mA and/or kV according to
patient size and/or use of iterative reconstruction technique.

CONTRAST:  100mL OMNIPAQUE IOHEXOL 300 MG/ML  SOLN
FINDINGS: CT CHEST FINDINGS

Cardiovascular: Heart size is normal. There is no significant
pericardial fluid, thickening or pericardial calcification. No
atherosclerotic calcifications are noted in the thoracic aorta or
the coronary arteries.

Mediastinum/Nodes: No pathologically enlarged mediastinal or hilar
lymph nodes. Esophagus is unremarkable in appearance. No axillary
lymphadenopathy.

Lungs/Pleura: No suspicious pulmonary nodules or masses are noted.
No acute consolidative airspace disease. No pleural effusions.

Musculoskeletal: There are no aggressive appearing lytic or blastic
lesions noted in the visualized portions of the skeleton.

CT ABDOMEN PELVIS FINDINGS

Hepatobiliary: No suspicious cystic or solid hepatic lesions. No
intra or extrahepatic biliary ductal dilatation. Gallbladder is
normal in appearance.

Pancreas: No pancreatic mass. No pancreatic ductal dilatation. No
pancreatic or peripancreatic fluid collections or inflammatory
changes.

Spleen: Unremarkable.

Adrenals/Urinary Tract: Bilateral kidneys and adrenal glands are
normal in appearance. No hydroureteronephrosis. Urinary bladder is
normal in appearance.

Stomach/Bowel: Normal appearance of the stomach. No pathologic
dilatation of small bowel or colon. Suture line in the sigmoid colon
from prior partial colectomy. No unexpected soft tissue mass at the
suture line to suggest locally recurrent disease. Normal appendix.

Vascular/Lymphatic: No significant atherosclerotic disease, aneurysm
or dissection noted in the abdominal or pelvic vasculature. No
lymphadenopathy noted in the abdomen or pelvis.

Reproductive: Prostate gland and seminal vesicles are unremarkable
in appearance.

Other: No significant volume of ascites.  No pneumoperitoneum.

Musculoskeletal: There are no aggressive appearing lytic or blastic
lesions noted in the visualized portions of the skeleton.
IMPRESSION: 1. No findings to suggest locally recurrent disease or metastatic
disease in the chest, abdomen or pelvis.

ADDENDUM:
As stated in the original dictation there is no lymphadenopathy
noted in the abdomen or pelvis. Previously noted left para-aortic
lymph node is not enlarged on today's examination (axial image 73 of
series 2) measuring 5 mm in short axis.

*** End of Addendum ***
RADIATION DOSE REDUCTION: This exam was performed according to the
departmental dose-optimization program which includes automated
exposure control, adjustment of the mA and/or kV according to
patient size and/or use of iterative reconstruction technique.

CONTRAST:  100mL OMNIPAQUE IOHEXOL 300 MG/ML  SOLN
FINDINGS: CT CHEST FINDINGS

Cardiovascular: Heart size is normal. There is no significant
pericardial fluid, thickening or pericardial calcification. No
atherosclerotic calcifications are noted in the thoracic aorta or
the coronary arteries.

Mediastinum/Nodes: No pathologically enlarged mediastinal or hilar
lymph nodes. Esophagus is unremarkable in appearance. No axillary
lymphadenopathy.

Lungs/Pleura: No suspicious pulmonary nodules or masses are noted.
No acute consolidative airspace disease. No pleural effusions.

Musculoskeletal: There are no aggressive appearing lytic or blastic
lesions noted in the visualized portions of the skeleton.

CT ABDOMEN PELVIS FINDINGS

Hepatobiliary: No suspicious cystic or solid hepatic lesions. No
intra or extrahepatic biliary ductal dilatation. Gallbladder is
normal in appearance.

Pancreas: No pancreatic mass. No pancreatic ductal dilatation. No
pancreatic or peripancreatic fluid collections or inflammatory
changes.

Spleen: Unremarkable.

Adrenals/Urinary Tract: Bilateral kidneys and adrenal glands are
normal in appearance. No hydroureteronephrosis. Urinary bladder is
normal in appearance.

Stomach/Bowel: Normal appearance of the stomach. No pathologic
dilatation of small bowel or colon. Suture line in the sigmoid colon
from prior partial colectomy. No unexpected soft tissue mass at the
suture line to suggest locally recurrent disease. Normal appendix.

Vascular/Lymphatic: No significant atherosclerotic disease, aneurysm
or dissection noted in the abdominal or pelvic vasculature. No
lymphadenopathy noted in the abdomen or pelvis.

Reproductive: Prostate gland and seminal vesicles are unremarkable
in appearance.

Other: No significant volume of ascites.  No pneumoperitoneum.

Musculoskeletal: There are no aggressive appearing lytic or blastic
lesions noted in the visualized portions of the skeleton.
IMPRESSION: 1. No findings to suggest locally recurrent disease or metastatic
disease in the chest, abdomen or pelvis.

## 2023-10-05 ENCOUNTER — Ambulatory Visit (INDEPENDENT_AMBULATORY_CARE_PROVIDER_SITE_OTHER)

## 2023-10-05 ENCOUNTER — Encounter: Payer: Self-pay | Admitting: Podiatry

## 2023-10-05 ENCOUNTER — Ambulatory Visit (INDEPENDENT_AMBULATORY_CARE_PROVIDER_SITE_OTHER): Payer: Self-pay | Admitting: Podiatry

## 2023-10-05 ENCOUNTER — Other Ambulatory Visit (HOSPITAL_BASED_OUTPATIENT_CLINIC_OR_DEPARTMENT_OTHER): Payer: Self-pay

## 2023-10-05 VITALS — Ht 72.0 in | Wt 218.0 lb

## 2023-10-05 DIAGNOSIS — M722 Plantar fascial fibromatosis: Secondary | ICD-10-CM

## 2023-10-05 DIAGNOSIS — M7751 Other enthesopathy of right foot: Secondary | ICD-10-CM | POA: Diagnosis not present

## 2023-10-05 DIAGNOSIS — M7752 Other enthesopathy of left foot: Secondary | ICD-10-CM | POA: Diagnosis not present

## 2023-10-05 MED ORDER — TERBINAFINE HCL 250 MG PO TABS
250.0000 mg | ORAL_TABLET | Freq: Every day | ORAL | 0 refills | Status: DC
  Filled 2023-10-05: qty 90, 90d supply, fill #0

## 2023-10-05 NOTE — Progress Notes (Signed)
 Chief Complaint  Patient presents with   Foot Pain    R foot pain  and pressure focused in heel pain 5-6 usually worse in AM and after inactivity Non Diabetic. No anti coag    Subjective: 58 y.o. male presenting today as a reestablish new patient for evaluation of 2 separate complaints.  The patient has been experiencing right plantar heel pain for few months now.  He has not anything for treatment.  He also has a long history of fungal nail infection.  He was treated about 10 years ago with oral Lamisil  which resolved his toenail fungus but his work environment was very wet and the toenail fungus slowly came back over time.   Past Medical History:  Diagnosis Date   Allergy    Arthritis    Cancer (HCC)    colon cancer 4-19- 2021- surgery 07-2019- pill chemo - no iv chemo, no radiation    Family history of breast cancer    Family history of colon cancer    Family history of ovarian cancer    GERD (gastroesophageal reflux disease)    mild    Sarcoidosis 1995   Sarcoidosis of lung (HCC) 1998    Past Surgical History:  Procedure Laterality Date   COLON SURGERY     colectomy 07-2019   COLONOSCOPY     FLEXIBLE SIGMOIDOSCOPY N/A 07/23/2019   Procedure: FLEXIBLE SIGMOIDOSCOPY;  Surgeon: Teresa Lonni HERO, MD;  Location: [facility] ORS;  Service: General;  Laterality: N/A;   KNEE ARTHROSCOPY  2010   right   KNEE ARTHROSCOPY Right 01/30/2016   Procedure: ARTHROSCOPY KNEE;  Surgeon: Marcey Her, MD;  Location: [facility] OR;  Service: Orthopedics;  Laterality: Right;   NECK SURGERY     lymphnode removed   POLYPECTOMY      B/L toenails 10/05/2023  Objective: Physical Exam General: The patient is alert and oriented x3 in no acute distress.  Dermatology: Keratotic dystrophic nails noted 1-5 bilateral  Vascular: Dorsalis Pedis and Posterior Tibial pulses palpable bilateral.  Capillary fill time is immediate to all digits.  Neurological: Grossly intact via light touch  Musculoskeletal:  Tenderness to palpation to the plantar aspect of the right heel along the plantar fascia. All other joints range of motion within normal limits bilateral. Strength 5/5 in all groups bilateral.   Radiographic exam RT foot 10/05/2023: Normal osseous mineralization. Joint spaces preserved. No fracture/dislocation/boney destruction. No other soft tissue abnormalities or radiopaque foreign bodies.   Assessment: 1. Plantar fasciitis right  -Patient evaluated. Xrays reviewed.   - Declined any oral anti-inflammatory medication as well as injection -Advised against going barefoot.  Recommended supportive tennis shoes and sneakers -Recommend daily calf stretching, specifically calf stretches to alleviate posterior calf tightness -Return to clinic PRN  2.  Fungal nail infection bilateral  -Today we discussed different treatment modalities including oral, topical, and laser antifungal treatment.  Relative efficacies as well as risks and benefits associated with each modality were explained.  The patient has had good success in the past with the oral Lamisil .  He opts for oral medication today.  He has routine blood work performed and according the patient  he has never had any liver pathology or elevated liver enzyme function on blood work -Prescription for Lamisil  to 50 mg #90 daily.  At the end of the 90 days he may request a refill but we will need to get updated LFTs prior to refill -Return to clinic PRN   Thresa EMERSON Sar, DPM [facility]  Dr. Thresa EMERSON Sar, DPM    [address] [place], [place] [address]                Office [phone]  Fax [phone]

## 2024-01-10 ENCOUNTER — Other Ambulatory Visit

## 2024-01-10 ENCOUNTER — Ambulatory Visit: Admitting: Oncology

## 2024-01-26 ENCOUNTER — Other Ambulatory Visit: Payer: Self-pay | Admitting: Podiatry

## 2024-01-27 ENCOUNTER — Other Ambulatory Visit: Payer: Self-pay | Admitting: Podiatry

## 2024-01-27 DIAGNOSIS — Z79899 Other long term (current) drug therapy: Secondary | ICD-10-CM

## 2024-01-27 NOTE — Progress Notes (Signed)
 Prior to refill Lamisil  need updated LFTs.  Order placed.  Thresa EMERSON Sar, DPM [facility]  Dr. Thresa EMERSON Sar, DPM    [address] [place], [place] [address]                Office [phone]  Fax [phone]

## 2024-01-31 ENCOUNTER — Encounter: Payer: Self-pay | Admitting: Podiatry

## 2024-02-01 ENCOUNTER — Inpatient Hospital Stay: Admitting: Oncology

## 2024-02-01 ENCOUNTER — Inpatient Hospital Stay: Attending: Oncology

## 2024-02-01 VITALS — BP 113/78 | HR 65 | Temp 98.1°F | Resp 18 | Ht 72.0 in | Wt 220.4 lb

## 2024-02-01 DIAGNOSIS — Z85038 Personal history of other malignant neoplasm of large intestine: Secondary | ICD-10-CM | POA: Insufficient documentation

## 2024-02-01 DIAGNOSIS — Z860101 Personal history of adenomatous and serrated colon polyps: Secondary | ICD-10-CM | POA: Insufficient documentation

## 2024-02-01 DIAGNOSIS — C187 Malignant neoplasm of sigmoid colon: Secondary | ICD-10-CM | POA: Diagnosis not present

## 2024-02-01 DIAGNOSIS — Z5181 Encounter for therapeutic drug level monitoring: Secondary | ICD-10-CM | POA: Diagnosis not present

## 2024-02-01 LAB — LIPID PANEL
Cholesterol: 288 mg/dL — ABNORMAL HIGH (ref 0–200)
HDL: 61 mg/dL (ref >40)
LDL Cholesterol: 202 mg/dL — ABNORMAL HIGH (ref 0–99)
Total CHOL/HDL Ratio: 4.7 ratio
Triglycerides: 123 mg/dL (ref <150)
VLDL: 25 mg/dL (ref 0–40)

## 2024-02-01 LAB — CEA (ACCESS): CEA (CHCC): <1 ng/mL (ref 0.00–5.00)

## 2024-02-01 NOTE — Progress Notes (Signed)
 [facility] OFFICE PROGRESS NOTE   Diagnosis: Colon cancer  INTERVAL HISTORY:   Brian Avery returns as scheduled.  He feels well.  No difficulty bowel function.  No bleeding.  Good appetite.  Objective:  Vital signs in last 24 hours:  Blood pressure 113/78, pulse 65, temperature 98.1 F (36.7 C), temperature source Temporal, resp. rate 18, height 6' (1.829 m), weight 220 lb 6.4 oz (100 kg), SpO2 100%.   Lymphatics: No cervical, supraclavicular, axillary, or inguinal nodes Resp: Lungs clear bilaterally Cardio: Regular rate and rhythm GI: No hepatosplenomegaly, nontender, no mass Vascular: No leg edema  Lab Results:  Lab Results  Component Value Date   WBC 4.9 02/01/2020   HGB 16.0 02/01/2020   HCT 46.2 02/01/2020   MCV 108.2 (H) 02/01/2020   PLT 254 02/01/2020   NEUTROABS 2.1 02/01/2020    CMP  Lab Results  Component Value Date   NA 134 (L) 05/10/2022   K 4.2 05/10/2022   CL 100 05/10/2022   CO2 29 05/10/2022   GLUCOSE 94 05/10/2022   BUN 24 (H) 05/10/2022   CREATININE 1.07 05/10/2022   CALCIUM 9.5 05/10/2022   PROT 7.4 02/01/2020   ALBUMIN 4.5 02/01/2020   AST 38 02/01/2020   ALT 25 02/01/2020   ALKPHOS 45 02/01/2020   BILITOT 0.6 02/01/2020   GFRNONAA >60 05/10/2022   GFRAA >60 10/31/2019    Lab Results  Component Value Date   CEA1 1.69 10/06/2020   CEA <1.00 02/01/2024    Lab Results  Component Value Date   INR 0.9 07/18/2019   LABPROT 12.2 07/18/2019    Imaging:  No results found.  Medications: I have reviewed the patient's current medications.   Assessment/Plan: Sigmoid colon cancer, stage II (T3N0) Colonoscopy 06/04/2019-cecum polyp, mass in the sigmoid colon beginning at 28 cm, biopsy confirmed a sessile serrated adenoma of the cecum, adenocarcinoma at the sigmoid colon CTs 06/05/2019-eccentric wall thickening of the proximal sigmoid colon with subcentimeter lymph nodes in the adjacent mesocolon, prominent nonspecific  retroperitoneal nodes-largest 1 x 1 cm Robotic assisted sigmoidectomy 07/23/2019, well differentiated adenocarcinoma of the proximal sigmoid colon, tumor extends through the muscularis propria, positive lymphovascular invasion, negative resection margins, no loss of mismatch repair protein expression Cycle 1 adjuvant Xeloda  08/13/2019 Cycle 2 adjuvant Xeloda  09/03/2019, held on 09/15/2019 secondary to hand/foot pain and hives, resumed 09/16/2019 Cycle 3 adjuvant Xeloda  09/24/2019, dose reduction to 1500 mg twice daily secondary to hand/foot syndrome Cycle 4 adjuvant Xeloda  10/15/2019, 1500 mg twice daily Cycle 5 adjuvant Xeloda  11/05/2019, 1500 mg twice daily Cycle 6 adjuvant Xeloda  11/26/2019, 2000 mg every morning, 1500 mg every evening for 14 days Cycle 7 adjuvant Xeloda  12/17/2019, 2000 mg every morning and 1500 mg every evening for 14 days Cycle 8 adjuvant Xeloda  01/07/2020, 2000 mg every morning and 1500 mg every evening CTs 04/03/2020-no evidence of metastatic disease, stable mildly enlarged left periaortic node Colonoscopy 06/23/2020-multiple polyps removed from the transverse and ascending colon, tubular adenoma and sessile serrated polyps, no high-grade dysplasia or malignancy CTs 04/01/2021-no evidence of metastatic disease, previously noted left periotic node not enlarged-5 mm Colonoscopy 07/27/2021-polyps removed from the ascending colon-sessile serrated polyp CT 05/10/2022-no evidence of recurrent disease, stable 5 mm left periotic node Colonoscopy 06/27/2023-polyp removed from the rectum-tubular adenoma Remote history of sarcoidosis-pulmonary and skin, he reports treatment with prednisone with resolution Family history of multiple cancers including breast, ovarian, and a gastrointestinal malignancy Custom next-Cancer panel-BARD1 VUS  Disposition: Brian Avery is in clinical remission from colon cancer.  He will return for an office visit and CEA in 6 months.  He will continue  colonoscopy surveillance with Dr. Shila.   Brian Hof, MD  02/01/2024  10:56 AM

## 2024-02-01 NOTE — Addendum Note (Signed)
 Addended by: STEVA DEVERE CROME on: 02/01/2024 01:05 PM   Modules accepted: Orders

## 2024-02-02 ENCOUNTER — Other Ambulatory Visit: Payer: Self-pay | Admitting: *Deleted

## 2024-02-02 ENCOUNTER — Other Ambulatory Visit: Payer: Self-pay

## 2024-02-02 DIAGNOSIS — Z5181 Encounter for therapeutic drug level monitoring: Secondary | ICD-10-CM

## 2024-02-02 DIAGNOSIS — C187 Malignant neoplasm of sigmoid colon: Secondary | ICD-10-CM

## 2024-02-02 LAB — HEPATIC FUNCTION PANEL
ALT: 31 U/L (ref 0–44)
AST: 20 U/L (ref 15–41)
Albumin: 4.5 g/dL (ref 3.5–5.0)
Alkaline Phosphatase: 60 U/L (ref 38–126)
Bilirubin, Direct: 0.2 mg/dL (ref 0.0–0.2)
Indirect Bilirubin: 0.3 mg/dL (ref 0.3–0.9)
Total Bilirubin: 0.4 mg/dL (ref 0.0–1.2)
Total Protein: 6.6 g/dL (ref 6.5–8.1)

## 2024-02-07 ENCOUNTER — Other Ambulatory Visit (HOSPITAL_BASED_OUTPATIENT_CLINIC_OR_DEPARTMENT_OTHER): Payer: Self-pay

## 2024-02-07 ENCOUNTER — Other Ambulatory Visit: Payer: Self-pay | Admitting: Podiatry

## 2024-02-10 ENCOUNTER — Other Ambulatory Visit (HOSPITAL_BASED_OUTPATIENT_CLINIC_OR_DEPARTMENT_OTHER): Payer: Self-pay

## 2024-02-10 MED ORDER — TERBINAFINE HCL 250 MG PO TABS
250.0000 mg | ORAL_TABLET | Freq: Every day | ORAL | 0 refills | Status: AC
  Filled 2024-02-10: qty 90, 90d supply, fill #0

## 2024-08-01 ENCOUNTER — Inpatient Hospital Stay

## 2024-08-01 ENCOUNTER — Inpatient Hospital Stay: Admitting: Oncology
